# Patient Record
Sex: Female | Born: 1960 | Race: White | Hispanic: No | Marital: Married | Smoking: Current every day smoker
Health system: Southern US, Community
[De-identification: ages and names within clinical notes are randomized; demographics above are authoritative.]

## PROBLEM LIST (undated history)

## (undated) DIAGNOSIS — C801 Malignant (primary) neoplasm, unspecified: Secondary | ICD-10-CM

## (undated) DIAGNOSIS — G5601 Carpal tunnel syndrome, right upper limb: Principal | ICD-10-CM

## (undated) DIAGNOSIS — M1990 Unspecified osteoarthritis, unspecified site: Secondary | ICD-10-CM

## (undated) DIAGNOSIS — K219 Gastro-esophageal reflux disease without esophagitis: Secondary | ICD-10-CM

## (undated) DIAGNOSIS — J449 Chronic obstructive pulmonary disease, unspecified: Secondary | ICD-10-CM

## (undated) DIAGNOSIS — T7840XA Allergy, unspecified, initial encounter: Secondary | ICD-10-CM

## (undated) DIAGNOSIS — D649 Anemia, unspecified: Secondary | ICD-10-CM

## (undated) DIAGNOSIS — G4730 Sleep apnea, unspecified: Secondary | ICD-10-CM

## (undated) DIAGNOSIS — F17200 Nicotine dependence, unspecified, uncomplicated: Secondary | ICD-10-CM

## (undated) DIAGNOSIS — L309 Dermatitis, unspecified: Secondary | ICD-10-CM

## (undated) HISTORY — DX: Gastro-esophageal reflux disease without esophagitis: K21.9

## (undated) HISTORY — DX: Dermatitis, unspecified: L30.9

## (undated) HISTORY — DX: Anemia, unspecified: D64.9

## (undated) HISTORY — DX: Unspecified osteoarthritis, unspecified site: M19.90

## (undated) HISTORY — DX: Chronic obstructive pulmonary disease, unspecified: J44.9

## (undated) HISTORY — DX: Sleep apnea, unspecified: G47.30

## (undated) HISTORY — DX: Nicotine dependence, unspecified, uncomplicated: F17.200

## (undated) HISTORY — DX: Malignant (primary) neoplasm, unspecified: C80.1

## (undated) HISTORY — PX: POLYPECTOMY: SHX149

## (undated) HISTORY — DX: Carpal tunnel syndrome, right upper limb: G56.01

## (undated) HISTORY — DX: Allergy, unspecified, initial encounter: T78.40XA

## (undated) HISTORY — PX: ABLATION: SHX5711

---

## 2009-06-30 ENCOUNTER — Ambulatory Visit (HOSPITAL_COMMUNITY): Admission: RE | Admit: 2009-06-30 | Discharge: 2009-06-30 | Payer: Self-pay | Admitting: Obstetrics & Gynecology

## 2009-06-30 ENCOUNTER — Encounter (INDEPENDENT_AMBULATORY_CARE_PROVIDER_SITE_OTHER): Payer: Self-pay | Admitting: Obstetrics & Gynecology

## 2011-03-12 LAB — CBC
MCHC: 34.9 g/dL (ref 30.0–36.0)
MCV: 92.1 fL (ref 78.0–100.0)
RBC: 4.43 MIL/uL (ref 3.87–5.11)

## 2011-04-18 NOTE — Op Note (Signed)
Jennifer Werner, Jennifer Werner                ACCOUNT NO.:  [id number]   MEDICAL RECORD NO.:  [id number]          PATIENT TYPE:  AMB   LOCATION:  SDC                           FACILITY:  [facility]   PHYSICIAN:  Ilda Mori, M.D.   DATE OF BIRTH:  February 18, 1961   DATE OF PROCEDURE:  06/30/2009  DATE OF DISCHARGE:                               OPERATIVE REPORT   PREOPERATIVE DIAGNOSES:  Menorrhagia, possible endometrial polyp.   POSTOPERATIVE DIAGNOSES:  Menorrhagia, endometrial polyp.   PROCEDURES:  Hysteroscopy, dilatation and curettage, NovaSure ablation.   SURGEON:  Ilda Mori, MD   ANESTHESIA:  Paracervical block with IV sedation.   ESTIMATED BLOOD LOSS:  Minimal.   FINDINGS:  Small endometrial polyp was noted on the anterior fundal  area.  Otherwise, the cavity was symmetric and normal.   INDICATIONS:  This is a 50 year old parous female who for the last 2 or  3 years has been having increasingly heavy and painful periods.  The  patient presented to the office with these complaints and options were  discussed with her including Mirena IUD.  The patient, however, opted  for the NovaSure endometrial ablation.  The risks and benefits were  discussed with her.   PROCEDURE IN DETAILS:  The patient was taken to the operating room,  placed in the dorsal lithotomy position.  IV sedation was administered.  The time-out was performed.  The patient was then prepped and draped in  sterile fashion.  The bladder was catheterized.  The evaluation under  anesthesia revealed a retroverted uterus.  No adnexal masses.  The 10 mL  of 2% lidocaine was infiltrated in the paracervical tissues.  The edges  of the cervix was grasped with single-tooth tenaculum.  The cervix was  sounded to 3 cm.  The fundus was sounded to 9 cm.  The internal os was  dilated to 21-French.  Hysteroscope was introduced and the endometrial  cavity was evaluated, small polyp was noted.  Hysteroscope was removed.  A polyp forceps  was applied and a small polyp was removed.  After this,  a sharp curettage was carried out.  The hysteroscope was reintroduced,  then the cavity appeared without polyps at this point.  The internal os  was then dilated to 25-French.  The NovaSure instrument was introduced  and deployed to a cavity width of 4.2 cm.  The tubing was tightened and  the CO2 test was passed.  Following this, ablation was carried around 55  seconds.  The procedure was then terminated and the patient left the  operative room in good condition.      Ilda Mori, M.D.  Electronically Signed    RK/MEDQ  D:  06/30/2009  T:  06/30/2009  Job:  [id number]

## 2012-06-26 ENCOUNTER — Ambulatory Visit (INDEPENDENT_AMBULATORY_CARE_PROVIDER_SITE_OTHER): Payer: No Typology Code available for payment source | Admitting: Family Medicine

## 2012-06-26 VITALS — BP 136/72 | HR 75 | Temp 98.6°F | Resp 17 | Ht 65.0 in | Wt 187.0 lb

## 2012-06-26 DIAGNOSIS — R141 Gas pain: Secondary | ICD-10-CM

## 2012-06-26 DIAGNOSIS — IMO0001 Reserved for inherently not codable concepts without codable children: Secondary | ICD-10-CM

## 2012-06-26 DIAGNOSIS — R5383 Other fatigue: Secondary | ICD-10-CM

## 2012-06-26 DIAGNOSIS — M791 Myalgia, unspecified site: Secondary | ICD-10-CM

## 2012-06-26 DIAGNOSIS — R140 Abdominal distension (gaseous): Secondary | ICD-10-CM

## 2012-06-26 DIAGNOSIS — R5381 Other malaise: Secondary | ICD-10-CM

## 2012-06-26 DIAGNOSIS — R21 Rash and other nonspecific skin eruption: Secondary | ICD-10-CM

## 2012-06-26 DIAGNOSIS — N393 Stress incontinence (female) (male): Secondary | ICD-10-CM

## 2012-06-26 DIAGNOSIS — R143 Flatulence: Secondary | ICD-10-CM

## 2012-06-26 LAB — POCT UA - MICROSCOPIC ONLY
Casts, Ur, LPF, POC: NEGATIVE
Crystals, Ur, HPF, POC: NEGATIVE
Mucus, UA: NEGATIVE
Yeast, UA: NEGATIVE

## 2012-06-26 LAB — POCT URINALYSIS DIPSTICK
Bilirubin, UA: NEGATIVE
Glucose, UA: NEGATIVE
Ketones, UA: NEGATIVE
Leukocytes, UA: NEGATIVE
Nitrite, UA: NEGATIVE
Protein, UA: NEGATIVE
Spec Grav, UA: 1.01
Urobilinogen, UA: 0.2
pH, UA: 5.5

## 2012-06-26 LAB — COMPREHENSIVE METABOLIC PANEL
ALT: 84 U/L — ABNORMAL HIGH (ref 0–35)
AST: 51 U/L — ABNORMAL HIGH (ref 0–37)
Albumin: 4.3 g/dL (ref 3.5–5.2)
Alkaline Phosphatase: 73 U/L (ref 39–117)
BUN: 7 mg/dL (ref 6–23)
CO2: 26 mEq/L (ref 19–32)
Calcium: 9.4 mg/dL (ref 8.4–10.5)
Chloride: 101 mEq/L (ref 96–112)
Creat: 0.7 mg/dL (ref 0.50–1.10)
Glucose, Bld: 76 mg/dL (ref 70–99)
Potassium: 3.8 mEq/L (ref 3.5–5.3)
Sodium: 136 mEq/L (ref 135–145)
Total Bilirubin: 0.6 mg/dL (ref 0.3–1.2)
Total Protein: 7.4 g/dL (ref 6.0–8.3)

## 2012-06-26 LAB — POCT CBC
Granulocyte percent: 66.5 %G (ref 37–80)
HCT, POC: 48.8 % — AB (ref 37.7–47.9)
Hemoglobin: 15.2 g/dL (ref 12.2–16.2)
Lymph, poc: 2.7 (ref 0.6–3.4)
MCH, POC: 28.8 pg (ref 27–31.2)
MCHC: 31.1 g/dL — AB (ref 31.8–35.4)
MCV: 92.4 fL (ref 80–97)
MID (cbc): 0.8 (ref 0–0.9)
MPV: 9 fL (ref 0–99.8)
POC Granulocyte: 6.8 (ref 2–6.9)
POC LYMPH PERCENT: 26.1 %L (ref 10–50)
POC MID %: 7.4 %M (ref 0–12)
Platelet Count, POC: 320 10*3/uL (ref 142–424)
RBC: 5.28 M/uL (ref 4.04–5.48)
RDW, POC: 14.8 %
WBC: 10.3 10*3/uL — AB (ref 4.6–10.2)

## 2012-06-26 LAB — TSH: TSH: 1.207 u[IU]/mL (ref 0.350–4.500)

## 2012-06-26 LAB — POCT SEDIMENTATION RATE: POCT SED RATE: 12 mm/hr (ref 0–22)

## 2012-06-26 NOTE — Progress Notes (Signed)
51 yo woman with 1. Insomnia x 1 year 2. Fatigue chronically 3. Weight gain 4. Eye fatigue 5. Swollen finger joints 6. Two months of abdominal bloating 7. Red dermatitis both arms diagnosed with eczema  F/H lupus (aunt), lymphoma (mother)  Had uterine ablation 1 year ago.  Has had eyes checked.   O:  NAD Flushed cheeks with rough skin HEENT: hard palate red rash Chest: clear Heart:  Reg, no murmur Abd:  No masses, HSM Skin:  Papular rash on both lower extremities, rough dry skin with erythema and scaling on both arms diffusely   A:  Unusual rash and multiple systemic symptoms.  P:  Check labs. 1. Rash  TSH, POCT urinalysis dipstick, POCT UA - Microscopic Only, Comprehensive metabolic panel, POCT CBC, ANA  2. Bloating  TSH  3. Fatigue  TSH, POCT CBC, ANA  4. Stress incontinence  POCT urinalysis dipstick, POCT UA - Microscopic Only  5. Myalgia  POCT SEDIMENTATION RATE

## 2012-06-27 ENCOUNTER — Other Ambulatory Visit: Payer: Self-pay | Admitting: Family Medicine

## 2012-06-27 DIAGNOSIS — R945 Abnormal results of liver function studies: Secondary | ICD-10-CM

## 2012-06-27 DIAGNOSIS — R7989 Other specified abnormal findings of blood chemistry: Secondary | ICD-10-CM

## 2012-06-27 LAB — ANA: Anti Nuclear Antibody(ANA): NEGATIVE

## 2012-07-01 ENCOUNTER — Other Ambulatory Visit: Payer: Self-pay | Admitting: Family Medicine

## 2012-07-01 DIAGNOSIS — R5383 Other fatigue: Secondary | ICD-10-CM

## 2013-07-30 ENCOUNTER — Encounter: Payer: Self-pay | Admitting: Family Medicine

## 2014-03-04 ENCOUNTER — Encounter: Payer: Self-pay | Admitting: Podiatry

## 2014-03-04 ENCOUNTER — Ambulatory Visit (INDEPENDENT_AMBULATORY_CARE_PROVIDER_SITE_OTHER): Payer: No Typology Code available for payment source | Admitting: Podiatry

## 2014-03-04 VITALS — BP 132/65 | HR 68 | Ht 64.0 in | Wt 190.0 lb

## 2014-03-04 DIAGNOSIS — M216X9 Other acquired deformities of unspecified foot: Secondary | ICD-10-CM | POA: Insufficient documentation

## 2014-03-04 DIAGNOSIS — M21969 Unspecified acquired deformity of unspecified lower leg: Secondary | ICD-10-CM | POA: Insufficient documentation

## 2014-03-04 DIAGNOSIS — M722 Plantar fascial fibromatosis: Secondary | ICD-10-CM

## 2014-03-04 HISTORY — DX: Other acquired deformities of unspecified foot: M21.6X9

## 2014-03-04 HISTORY — DX: Plantar fascial fibromatosis: M72.2

## 2014-03-04 HISTORY — DX: Unspecified acquired deformity of unspecified lower leg: M21.969

## 2014-03-04 NOTE — Progress Notes (Signed)
Subjective: 53 year old female presents complaining of left heel pain, plantar lateral, and in center x 2 years off and on.  Last 2 months been flared up bad and not going away. Gotten OTC inserts and new pair shoes. They are not helping.  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  Review of Systems - General ROS: negative for - chills, fatigue, fever, night sweats or weight loss Psychological ROS: negative Ophthalmic ROS: negative ENT ROS: negative Endocrine ROS: negative Breast ROS: negative for breast lumps Respiratory ROS: no cough, shortness of breath, or wheezing Cardiovascular ROS: no chest pain or dyspnea on exertion Gastrointestinal ROS: no abdominal pain, change in bowel habits, or black or bloody stools Genito-Urinary ROS: no dysuria, trouble voiding, or hematuria Musculoskeletal ROS: Only foot pain. Neurological ROS: negative Dermatological ROS: Eczema on arms and leg.  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 Pain at plantar lateral left. Neurovascular status are within normal. Rectus foot without gross deformities. Radiographic examination show high arched foot with elevated first ray bilateral. Posterior calcaneal spur bilateral. No spur at plantar heel.   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 Plantar fasciitis left. Metatarus primus elevatus bilateral. Cavus foot.  Plan: Reviewed findings and available treatment options, such as NSAIA, pain pills, Orthotics, Night splint, Cortisone inections.  Left heel injected with mixture of 4mg  Dexamethasone and 4mg  Triamcinolone and 38ml of 0.5% Marcaine plain and 1% Xylocaine plain. Patient tolerated well.  Night splint dispensed for left lower limb. Discussed Orthotic preparation.

## 2014-03-04 NOTE — Patient Instructions (Signed)
Seen for heel pain.  Noted of tight Achilles tendon L>R. X-ray shoe bony spur near achilles tendon and dorsally displacing first metatarsal bone. Injection to left heel and Night splint dispensed. Will contact insurance co for orthotic coverage. Will do orthotics on next visit.

## 2014-03-09 ENCOUNTER — Ambulatory Visit: Payer: No Typology Code available for payment source | Admitting: Podiatry

## 2018-04-02 ENCOUNTER — Encounter: Payer: Self-pay | Admitting: Physician Assistant

## 2018-04-02 ENCOUNTER — Ambulatory Visit (INDEPENDENT_AMBULATORY_CARE_PROVIDER_SITE_OTHER): Payer: No Typology Code available for payment source

## 2018-04-02 ENCOUNTER — Other Ambulatory Visit: Payer: Self-pay

## 2018-04-02 ENCOUNTER — Ambulatory Visit: Payer: No Typology Code available for payment source | Admitting: Physician Assistant

## 2018-04-02 VITALS — BP 130/78 | HR 88 | Temp 98.6°F | Resp 18 | Ht 64.57 in | Wt 207.0 lb

## 2018-04-02 DIAGNOSIS — F17200 Nicotine dependence, unspecified, uncomplicated: Secondary | ICD-10-CM | POA: Diagnosis not present

## 2018-04-02 DIAGNOSIS — M7989 Other specified soft tissue disorders: Secondary | ICD-10-CM

## 2018-04-02 DIAGNOSIS — R0989 Other specified symptoms and signs involving the circulatory and respiratory systems: Secondary | ICD-10-CM | POA: Diagnosis not present

## 2018-04-02 DIAGNOSIS — Z1329 Encounter for screening for other suspected endocrine disorder: Secondary | ICD-10-CM | POA: Diagnosis not present

## 2018-04-02 DIAGNOSIS — R0602 Shortness of breath: Secondary | ICD-10-CM

## 2018-04-02 DIAGNOSIS — E66811 Obesity, class 1: Secondary | ICD-10-CM

## 2018-04-02 DIAGNOSIS — F17210 Nicotine dependence, cigarettes, uncomplicated: Secondary | ICD-10-CM | POA: Insufficient documentation

## 2018-04-02 DIAGNOSIS — R0902 Hypoxemia: Secondary | ICD-10-CM

## 2018-04-02 DIAGNOSIS — E669 Obesity, unspecified: Secondary | ICD-10-CM

## 2018-04-02 DIAGNOSIS — Z13220 Encounter for screening for lipoid disorders: Secondary | ICD-10-CM | POA: Diagnosis not present

## 2018-04-02 DIAGNOSIS — I517 Cardiomegaly: Secondary | ICD-10-CM | POA: Diagnosis not present

## 2018-04-02 DIAGNOSIS — Z87891 Personal history of nicotine dependence: Secondary | ICD-10-CM

## 2018-04-02 HISTORY — DX: Personal history of nicotine dependence: Z87.891

## 2018-04-02 HISTORY — DX: Nicotine dependence, cigarettes, uncomplicated: F17.210

## 2018-04-02 MED ORDER — PREDNISONE 10 MG PO TABS: ORAL_TABLET | ORAL | 0 refills | Status: AC

## 2018-04-02 MED ORDER — IPRATROPIUM-ALBUTEROL 20-100 MCG/ACT IN AERS: 1.0000 | INHALATION_SPRAY | Freq: Four times a day (QID) | RESPIRATORY_TRACT | 1 refills | Status: DC

## 2018-04-02 MED ORDER — IPRATROPIUM BROMIDE 0.02 % IN SOLN
0.5000 mg | Freq: Once | RESPIRATORY_TRACT | Status: AC
  Administered 2018-04-02: 0.5 mg via RESPIRATORY_TRACT

## 2018-04-02 MED ORDER — FUROSEMIDE 20 MG PO TABS: 20.0000 mg | ORAL_TABLET | Freq: Every day | ORAL | 0 refills | Status: DC

## 2018-04-02 MED ORDER — ALBUTEROL SULFATE (2.5 MG/3ML) 0.083% IN NEBU
2.5000 mg | INHALATION_SOLUTION | Freq: Once | RESPIRATORY_TRACT | Status: AC
  Administered 2018-04-02: 2.5 mg via RESPIRATORY_TRACT

## 2018-04-02 MED ORDER — PREDNISONE 10 MG PO TABS: ORAL_TABLET | ORAL | 0 refills | Status: DC

## 2018-04-02 MED ORDER — VARENICLINE TARTRATE 0.5 MG X 11 & 1 MG X 42 PO MISC: ORAL | 0 refills | Status: DC

## 2018-04-02 MED ORDER — FLUTICASONE-SALMETEROL 500-50 MCG/DOSE IN AEPB: 1.0000 | INHALATION_SPRAY | Freq: Two times a day (BID) | RESPIRATORY_TRACT | 1 refills | Status: DC

## 2018-04-02 NOTE — Progress Notes (Signed)
Jennifer Werner  MRN: [id number] DOB: Feb 20, 1961  PCP: [name], PA-C  Chief Complaint  Patient presents with  . Establish Care    Subjective:  Pt presents to clinic to establish care.  She has not had routine medical care for several years.  She is worried about shortness of breath.  - she has had for the last couple of years worse over the last 2-3 months though nothing has changed in her lifestyle or smoking.  She is short of breath all the time but activity makes it worse. - she is a smoker.  Leaning on her chart while shopping.  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  She does not have chest pain while she has shortness of breath.  Home oximetry - at rest 90% moving 77% - over the last several weeks - she is worried now.  She is swollen in the finger and foot - she has been using lasix that she got from her mother.  It has helped her breathing.  Leg swelling is almost gone in the am - worse as the day goes on.  She has compression socks but does not wear them regularly and does not know what compression they are.   Smokes - 1.5 ppd - in the past 2 ppd - 47 years - 60-70 pack year history.  She works - Marine scientist - Press photographer work.  She has rashes all over the body - she has had dermatologist in the past and has been diagnosed with eczema.  She has had trouble with her skin all of her life currently she does not use soap but rather lotion as she days due to extremely dry skin.  She also has spots that she develops that comes and goes with times some times they open and dark red blood comes out or pus sometimes they completely resolve on their own.  These are located in her axilla underneath her breasts where her bra sits in her groin.  When she has these they hurt when they resolve she has no problems.  History is obtained by patient.  Review of Systems  Constitutional: Negative for chills  and fever.  HENT: Negative.   Respiratory: Positive for cough (am productive - yellowish -- as the day goes on dry and then wet in the evening), shortness of breath and wheezing (all the time - ).   Cardiovascular: Positive for leg swelling. Negative for chest pain.  Allergic/Immunologic: Negative for environmental allergies.  Psychiatric/Behavioral: Positive for sleep disturbance (problem for the last 10 years - cannot get to sleep nor stay asleep).    Patient Active Problem List   Diagnosis Date Noted  . Smoker 04/02/2018  . Plantar fasciitis of left foot 03/04/2014  . Metatarsal deformity 03/04/2014  . Equinus deformity of foot, acquired 03/04/2014    No current outpatient medications on file prior to visit.   No current facility-administered medications on file prior to visit.     Allergies  Allergen Reactions  . Latex Rash  . Codeine     No past medical history on file. Social History   Social History Narrative  . Not on file   Social History   Tobacco Use  . Smoking status: Current Every Day Smoker    Packs/day: 2.00    Years: 37.00    Pack years: 74.00    Types: Cigarettes  .  Smokeless tobacco: Never Used  Substance Use Topics  . Alcohol use: Not on file  . Drug use: Not on file   family history is not on file.     Objective:  BP 130/78   Pulse 88   Temp 98.6 F (37 C) (Oral)   Resp 18   Ht 5' 4.57" (1.64 m)   Wt 207 lb (93.9 kg)   SpO2 (!) 87%   PF 100 L/min   BMI 34.91 kg/m  Body mass index is 34.91 kg/m.  Physical Exam  Constitutional: She is oriented to person, place, and time. She appears well-developed and well-nourished.  HENT:  Head: Normocephalic and atraumatic.  Right Ear: Hearing and external ear normal.  Left Ear: Hearing and external ear normal.  Eyes: Conjunctivae are normal.  Neck: Normal range of motion.  Cardiovascular: Normal rate, regular rhythm and normal heart sounds.  No murmur heard. Pulmonary/Chest: Effort  normal. She has wheezes (end inspiratory wheezing).  Able to talk in full sentences and quickly - she does have audible wheezing when close.  Expiration longer than inspiration - breaths sounds difficult to hear.  Neurological: She is alert and oriented to person, place, and time.  Skin: Skin is warm and dry.  Psychiatric: She has a normal mood and affect. Her behavior is normal. Judgment and thought content normal.  Vitals reviewed.  Rhythm: sinus rhythm  at a rate of 67. Findings: NSR without acute changes Last EKG: none  I have personally reviewed the EKG tracing and agree with the computerized printout.  Dg Chest 2 View  Result Date: 04/02/2018 CLINICAL DATA:  Shortness of breath, low oxygen saturation, smoking history EXAM: CHEST - 2 VIEW COMPARISON:  None. FINDINGS: No active infiltrate or effusion is seen. Minimal scarring is noted on the lateral view in the region of the lingula. Mediastinal and hilar contours are unremarkable. The heart is within upper limits of normal. No bony abnormality is seen. IMPRESSION: 1. No active lung disease. Probable linear scarring in the region of the lingula. 2. Borderline cardiomegaly. Electronically Signed   By: Ivar Drape M.D.   On: 04/02/2018 12:14   After albuterol atrovent neb- more air movement - pt feels less SOB - her pulse ox was 90% while talking after walking 2 laps while talking she went to 77% - she felt better - she did not have to stop and lean on wall as she would normally have had to do.   Assessment and Plan :  Shortness of breath - Plan: CBC with Differential/Platelet, CMP14+EGFR, DG Chest 2 View, albuterol (PROVENTIL) (2.5 MG/3ML) 0.083% nebulizer solution 2.5 mg, ipratropium (ATROVENT) nebulizer solution 0.5 mg, Ipratropium-Albuterol (COMBIVENT RESPIMAT) 20-100 MCG/ACT AERS respimat, Fluticasone-Salmeterol (ADVAIR DISKUS) 500-50 MCG/DOSE AEPB, Ambulatory referral to Cardiology, predniSONE (DELTASONE) 10 MG tablet, DISCONTINUED:  predniSONE (DELTASONE) 10 MG tablet  Smoker - Plan: DG Chest 2 View, varenicline (CHANTIX STARTING MONTH PAK) 0.5 MG X 11 & 1 MG X 42 tablet, predniSONE (DELTASONE) 10 MG tablet  Hypoxemia - Plan: EKG 12-Lead, DG Chest 2 View, Ambulatory referral to Pulmonology, predniSONE (DELTASONE) 10 MG tablet  Screening cholesterol level - Plan: Lipid panel  Screening for thyroid disorder - Plan: TSH  Obesity, Class I, BMI 30-34.9 - Plan: Hemoglobin A1c  Abnormally low peak expiratory flow rate  Leg swelling - Plan: furosemide (LASIX) 20 MG tablet, Ambulatory referral to Vascular Surgery  Mild cardiomegaly - Plan: Ambulatory referral to Cardiology   We will do screening labs today as she  has not had medical care for a while.  With a normal EKG negative chest x-ray I feel confident that this is likely COPD especially with her smoking history.  We will start Chantix to help decrease her smoking at this time.  A starter pack was given and she will follow-up with me in a month.  She has been on prednisone in the past that really helped her breathing we will start a 12-day taper at this time as well as start Advair and Combivent 4 times a day to help this likely COPD diagnosis.  She is not interested in oxygen at this time she does have a pulse oximetry at home she will continue to monitor this if it gets worse than what she has had in the last 2 to 3 weeks she will contact me.  She likely would benefit from oxygen use especially while asleep though at this time she is not interested.  Due to her hypoxemia we will also send to cardiology for questionable possible pulmonary hypertension and likely needing an echo to evaluate this.  Due to the swelling her legs we have also referred her to vascular for ultrasounds she was encouraged to use compression socks as this may help her swelling she was given a refill of Lasix to help with this in the meantime.  She did like the clinic today feeling much better than she has  in weeks per patient.  D/w Dr Kathy Breach PA-C  [facility] 04/02/2018 1:12 PM

## 2018-04-02 NOTE — Patient Instructions (Addendum)
  Compression sock - 25-35 mghg - go2socks or CEP are great  Please download the APP called mychart - then use the text to activate this APP - this will allow you to look at your labs and contact me as well as make appointments to see me in the future.    IF you received an x-ray today, you will receive an invoice from [facility] Radiology. Please contact [facility] at [phone] with questions or concerns regarding your invoice.   IF you received labwork today, you will receive an invoice from [facility]. Please contact [facility] at [phone] with questions or concerns regarding your invoice.   Our billing staff will not be able to assist you with questions regarding bills from these companies.  You will be contacted with the lab results as soon as they are available. The fastest way to get your results is to activate your My Chart account. Instructions are located on the last page of this paperwork. If you have not heard from [place] regarding the results in 2 weeks, please contact this office.

## 2018-04-03 LAB — CMP14+EGFR
A/G RATIO: 1.5 (ref 1.2–2.2)
ALT: 48 IU/L — ABNORMAL HIGH (ref 0–32)
AST: 34 IU/L (ref 0–40)
Albumin: 4.3 g/dL (ref 3.5–5.5)
Alkaline Phosphatase: 79 IU/L (ref 39–117)
BUN/Creatinine Ratio: 12 (ref 9–23)
BUN: 9 mg/dL (ref 6–24)
Bilirubin Total: 0.3 mg/dL (ref 0.0–1.2)
CALCIUM: 9.8 mg/dL (ref 8.7–10.2)
CO2: 28 mmol/L (ref 20–29)
Chloride: 100 mmol/L (ref 96–106)
Creatinine, Ser: 0.74 mg/dL (ref 0.57–1.00)
GFR, EST AFRICAN AMERICAN: 104 mL/min/{1.73_m2} (ref >59)
GFR, EST NON AFRICAN AMERICAN: 90 mL/min/{1.73_m2} (ref >59)
GLOBULIN, TOTAL: 2.8 g/dL (ref 1.5–4.5)
Glucose: 84 mg/dL (ref 65–99)
POTASSIUM: 4.9 mmol/L (ref 3.5–5.2)
Sodium: 144 mmol/L (ref 134–144)
Total Protein: 7.1 g/dL (ref 6.0–8.5)

## 2018-04-03 LAB — CBC WITH DIFFERENTIAL/PLATELET
BASOS: 0 %
Basophils Absolute: 0 10*3/uL (ref 0.0–0.2)
EOS (ABSOLUTE): 0.2 10*3/uL (ref 0.0–0.4)
EOS: 3 %
HEMOGLOBIN: 16.6 g/dL — AB (ref 11.1–15.9)
Hematocrit: 50.3 % — ABNORMAL HIGH (ref 34.0–46.6)
IMMATURE GRANULOCYTES: 0 %
Immature Grans (Abs): 0 10*3/uL (ref 0.0–0.1)
Lymphocytes Absolute: 1.8 10*3/uL (ref 0.7–3.1)
Lymphs: 22 %
MCH: 29.2 pg (ref 26.6–33.0)
MCHC: 33 g/dL (ref 31.5–35.7)
MCV: 88 fL (ref 79–97)
Monocytes Absolute: 0.6 10*3/uL (ref 0.1–0.9)
Monocytes: 8 %
NEUTROS PCT: 67 %
Neutrophils Absolute: 5.6 10*3/uL (ref 1.4–7.0)
Platelets: 260 10*3/uL (ref 150–379)
RBC: 5.69 x10E6/uL — ABNORMAL HIGH (ref 3.77–5.28)
RDW: 14.8 % (ref 12.3–15.4)
WBC: 8.3 10*3/uL (ref 3.4–10.8)

## 2018-04-03 LAB — LIPID PANEL
CHOL/HDL RATIO: 3.6 ratio (ref 0.0–4.4)
Cholesterol, Total: 166 mg/dL (ref 100–199)
HDL: 46 mg/dL (ref >39)
LDL Calculated: 102 mg/dL — ABNORMAL HIGH (ref 0–99)
Triglycerides: 90 mg/dL (ref 0–149)
VLDL Cholesterol Cal: 18 mg/dL (ref 5–40)

## 2018-04-03 LAB — HEMOGLOBIN A1C
ESTIMATED AVERAGE GLUCOSE: 123 mg/dL
Hgb A1c MFr Bld: 5.9 % — ABNORMAL HIGH (ref 4.8–5.6)

## 2018-04-03 LAB — TSH: TSH: 0.886 u[IU]/mL (ref 0.450–4.500)

## 2018-04-09 ENCOUNTER — Ambulatory Visit: Payer: PRIVATE HEALTH INSURANCE | Admitting: Cardiology

## 2018-04-09 ENCOUNTER — Encounter: Payer: Self-pay | Admitting: Cardiology

## 2018-04-09 VITALS — BP 130/68 | HR 62 | Resp 93 | Ht 64.75 in | Wt 208.8 lb

## 2018-04-09 DIAGNOSIS — R0602 Shortness of breath: Secondary | ICD-10-CM | POA: Diagnosis not present

## 2018-04-09 DIAGNOSIS — R0609 Other forms of dyspnea: Secondary | ICD-10-CM | POA: Insufficient documentation

## 2018-04-09 DIAGNOSIS — J449 Chronic obstructive pulmonary disease, unspecified: Secondary | ICD-10-CM | POA: Insufficient documentation

## 2018-04-09 DIAGNOSIS — R0600 Dyspnea, unspecified: Secondary | ICD-10-CM

## 2018-04-09 DIAGNOSIS — F17200 Nicotine dependence, unspecified, uncomplicated: Secondary | ICD-10-CM | POA: Diagnosis not present

## 2018-04-09 DIAGNOSIS — J410 Simple chronic bronchitis: Secondary | ICD-10-CM

## 2018-04-09 DIAGNOSIS — M7989 Other specified soft tissue disorders: Secondary | ICD-10-CM

## 2018-04-09 HISTORY — DX: Dyspnea, unspecified: R06.00

## 2018-04-09 HISTORY — DX: Chronic obstructive pulmonary disease, unspecified: J44.9

## 2018-04-09 HISTORY — DX: Other forms of dyspnea: R06.09

## 2018-04-09 MED ORDER — FUROSEMIDE 40 MG PO TABS: 40.0000 mg | ORAL_TABLET | Freq: Every day | ORAL | 3 refills | Status: DC

## 2018-04-09 MED ORDER — POTASSIUM CHLORIDE ER 10 MEQ PO TBCR: 10.0000 meq | EXTENDED_RELEASE_TABLET | Freq: Every day | ORAL | 3 refills | Status: DC

## 2018-04-09 NOTE — Progress Notes (Signed)
Cardiology Consultation:    Date:  04/09/2018   ID:  Jennifer Werner, DOB 11/04/1961, MRN [id number]  PCP:  [name], PA-C  Cardiologist:  Jenne Campus, MD   Referring MD: [name], PA-C   Chief Complaint  Patient presents with  . Shortness of Breath  . Cardiomegaly  I am short of breath  History of Present Illness:    Jennifer Werner is a 57 y.o. female who is being seen today for the evaluation of shortness of breath at the request of Weber, Damaris Hippo, PA-C.  She comes to [place] because of shortness of breath.  This is a gradual onset over.  Of months to years however lately she became severely short of breath to the point that she was not able to function.  She went to see her primary care physician she was given bronchodilators as well as some diuretic and she feels dramatically better she tells me 80% better last week and for the first time she was able to do laundry and walk up and down stairs with no major difficulties.  Described to have some swelling of lower extremities also some shortness of breath at night.  She is a Marine scientist by profession and she thinks she may be having some congestive heart failure.  She is a chronic smoker has been smoking for many years and she is try Chantix right now to quit.  Denies have any chest pain tightness squeezing pressure burning chest no palpitations.  Past Medical History:  Diagnosis Date  . Eczema   . Smoker     Past Surgical History:  Procedure Laterality Date  . ABLATION     Uterus    Current Medications: Current Meds  Medication Sig  . Fluticasone-Salmeterol (ADVAIR DISKUS) 500-50 MCG/DOSE AEPB Inhale 1 puff into the lungs 2 (two) times daily.  . furosemide (LASIX) 20 MG tablet Take 1 tablet (20 mg total) by mouth daily.  . Ipratropium-Albuterol (COMBIVENT RESPIMAT) 20-100 MCG/ACT AERS respimat Inhale 1 puff into the lungs every 6 (six) hours.  . varenicline (CHANTIX STARTING MONTH PAK) 0.5 MG X 11 & 1 MG X 42 tablet Take  0.5 mg tablet by po qd for 3 days, then 0.5 mg po bid for 4 days, then  1 mg po bid.     Allergies:   Latex and Codeine   Social History   Socioeconomic History  . Marital status: Married    Spouse name: Not on file  . Number of children: Not on file  . Years of education: Not on file  . Highest education level: Not on file  Occupational History  . Not on file  Social Needs  . Financial resource strain: Not on file  . Food insecurity:    Worry: Not on file    Inability: Not on file  . Transportation needs:    Medical: Not on file    Non-medical: Not on file  Tobacco Use  . Smoking status: Current Every Day Smoker    Packs/day: 2.00    Years: 37.00    Pack years: 74.00    Types: Cigarettes  . Smokeless tobacco: Never Used  Substance and Sexual Activity  . Alcohol use: Yes    Comment: 5-7 drinks a week  . Drug use: Never  . Sexual activity: Not on file  Lifestyle  . Physical activity:    Days per week: Not on file    Minutes per session: Not on file  .  Stress: Not on file  Relationships  . Social connections:    Talks on phone: Not on file    Gets together: Not on file    Attends religious service: Not on file    Active member of club or organization: Not on file    Attends meetings of clubs or organizations: Not on file    Relationship status: Not on file  Other Topics Concern  . Not on file  Social History Narrative   RN - desk work mainly   Lives with husband     Family History: The patient's family history includes Alcohol abuse in her brother and father; Cardiomyopathy in her brother, father, paternal aunt, and paternal uncle; Congestive Heart Failure in her mother; Heart block in her mother; Lymphoma in her mother. ROS:   Please see the history of present illness.    All 14 point review of systems negative except as described per history of present illness.  EKGs/Labs/Other Studies Reviewed:    The following studies were reviewed today:   EKG:   EKG is  ordered today.  The ekg ordered today demonstrates normal sinus rhythm normal P interval normal QS complex duration morphology  Recent Labs: 04/02/2018: ALT 48; BUN 9; Creatinine, Ser 0.74; Hemoglobin 16.6; Platelets 260; Potassium 4.9; Sodium 144; TSH 0.886  Recent Lipid Panel    Component Value Date/Time   CHOL 166 04/02/2018 1222   TRIG 90 04/02/2018 1222   HDL 46 04/02/2018 1222   CHOLHDL 3.6 04/02/2018 1222   LDLCALC 102 (H) 04/02/2018 1222    Physical Exam:    VS:  BP 130/68 (BP Location: Left Arm)   Pulse 62   Resp (!) 93   Ht 5' 4.75" (1.645 m)   Wt 208 lb 12.8 oz (94.7 kg)   BMI 35.01 kg/m     Wt Readings from Last 3 Encounters:  04/09/18 208 lb 12.8 oz (94.7 kg)  04/02/18 207 lb (93.9 kg)  03/04/14 190 lb (86.2 kg)     GEN:  Well nourished, well developed in no acute distress HEENT: Normal NECK: No JVD; No carotid bruits LYMPHATICS: No lymphadenopathy CARDIAC: RRR, no murmurs, no rubs, no gallops RESPIRATORY:  Clear to auscultation without rales, wheezing or rhonchi  ABDOMEN: Soft, non-tender, non-distended MUSCULOSKELETAL:  No edema; No deformity  SKIN: Warm and dry NEUROLOGIC:  Alert and oriented x 3 PSYCHIATRIC:  Normal affect   ASSESSMENT:    1. Smoker   2. Dyspnea on exertion   3. Simple chronic bronchitis (HCC)    PLAN:    In order of problems listed above:  1. Dyspnea on exertion: Most likely multifactorial.  She already feels better but is difficult to judge if this is bronchodilators of diuretic that helps.  I will ask her to have an echocardiogram to assess left ventricular ejection fraction.  We will do Chem-7 as well as proBNP today.  See her back in my office in about 3 weeks to see how she does. 2. Smoking: She already started Chantix we did talk about techniques that she can use to help to quit smoking.  She is determined and hopefully she will be able to do it. 3. COPD: Managed by internal medicine team.  See her back in my  office in about 3 weeks or sooner if she has a problem   Medication Adjustments/Labs and Tests Ordered: Current medicines are reviewed at length with the patient today.  Concerns regarding medicines are outlined above.  No orders of  the defined types were placed in this encounter.  No orders of the defined types were placed in this encounter.   Signed, Park Liter, MD, [facility]. 04/09/2018 9:35 AM    [place]

## 2018-04-09 NOTE — Patient Instructions (Signed)
Medication Instructions:  Your physician has recommended you make the following change in your medication:  START potassium 10 mEq daily INCREASE furosemide (lasix) 40 mg daily   Labwork: Your physician recommends that you return for lab work today: BMP, proBNP.   Testing/Procedures: Your physician has requested that you have an echocardiogram. Echocardiography is a painless test that uses sound waves to create images of your heart. It provides your doctor with information about the size and shape of your heart and how well your heart's chambers and valves are working. This procedure takes approximately one hour. There are no restrictions for this procedure.  Follow-Up: Your physician recommends that you schedule a follow-up appointment in: 3 weeks.   Any Other Special Instructions Will Be Listed Below (If Applicable).     If you need a refill on your cardiac medications before your next appointment, please call your pharmacy.

## 2018-04-10 LAB — BASIC METABOLIC PANEL
BUN/Creatinine Ratio: 21 (ref 9–23)
BUN: 14 mg/dL (ref 6–24)
CO2: 25 mmol/L (ref 20–29)
Calcium: 9.6 mg/dL (ref 8.7–10.2)
Chloride: 99 mmol/L (ref 96–106)
Creatinine, Ser: 0.67 mg/dL (ref 0.57–1.00)
GFR, EST AFRICAN AMERICAN: 113 mL/min/{1.73_m2} (ref >59)
GFR, EST NON AFRICAN AMERICAN: 98 mL/min/{1.73_m2} (ref >59)
Glucose: 89 mg/dL (ref 65–99)
Potassium: 4.4 mmol/L (ref 3.5–5.2)
SODIUM: 140 mmol/L (ref 134–144)

## 2018-04-10 LAB — PRO B NATRIURETIC PEPTIDE: NT-Pro BNP: 30 pg/mL (ref 0–287)

## 2018-04-16 ENCOUNTER — Other Ambulatory Visit (HOSPITAL_COMMUNITY): Payer: PRIVATE HEALTH INSURANCE

## 2018-04-18 ENCOUNTER — Other Ambulatory Visit: Payer: Self-pay

## 2018-04-18 ENCOUNTER — Ambulatory Visit (HOSPITAL_COMMUNITY): Payer: PRIVATE HEALTH INSURANCE | Attending: Cardiovascular Disease

## 2018-04-18 DIAGNOSIS — Z720 Tobacco use: Secondary | ICD-10-CM | POA: Insufficient documentation

## 2018-04-18 DIAGNOSIS — I517 Cardiomegaly: Secondary | ICD-10-CM | POA: Diagnosis not present

## 2018-04-18 DIAGNOSIS — I313 Pericardial effusion (noninflammatory): Secondary | ICD-10-CM | POA: Diagnosis not present

## 2018-04-18 DIAGNOSIS — R0609 Other forms of dyspnea: Secondary | ICD-10-CM | POA: Insufficient documentation

## 2018-04-18 DIAGNOSIS — M7989 Other specified soft tissue disorders: Secondary | ICD-10-CM

## 2018-04-19 ENCOUNTER — Encounter: Payer: Self-pay | Admitting: Physician Assistant

## 2018-04-19 ENCOUNTER — Ambulatory Visit (INDEPENDENT_AMBULATORY_CARE_PROVIDER_SITE_OTHER): Payer: No Typology Code available for payment source | Admitting: Physician Assistant

## 2018-04-19 ENCOUNTER — Other Ambulatory Visit: Payer: Self-pay

## 2018-04-19 VITALS — BP 118/72 | HR 85 | Temp 99.3°F | Resp 18 | Ht 64.75 in | Wt 212.6 lb

## 2018-04-19 DIAGNOSIS — F17200 Nicotine dependence, unspecified, uncomplicated: Secondary | ICD-10-CM | POA: Diagnosis not present

## 2018-04-19 DIAGNOSIS — R0602 Shortness of breath: Secondary | ICD-10-CM

## 2018-04-19 DIAGNOSIS — G4710 Hypersomnia, unspecified: Secondary | ICD-10-CM | POA: Diagnosis not present

## 2018-04-19 DIAGNOSIS — G479 Sleep disorder, unspecified: Secondary | ICD-10-CM

## 2018-04-19 NOTE — Progress Notes (Signed)
Jennifer Werner  MRN: [id number] DOB: 12-Mar-1961  PCP: [name], PA-C  Chief Complaint  Patient presents with  . Follow-up    SOB     Subjective:  Pt presents to clinic for recheck of her SOB.  She feels so much better starting her 2 inhalers.  She went on vacation last week and was able to walk up steps which she knew a month ago she would not have been able to do.  She is seen the cardiologist and had an echocardiogram which is good cardiac function but she continues to have feet swelling if she misses her Lasix dose.  She saw the cardiologist for her Lasix dose was increased to 40 mg, she has been taking 20 mg in the morning and augmenting that with an additional 20 in the afternoon if she has a swelling at that time.  O2 sat at home 90-94% -this was after walking up and down the steps several times.  Day 11 on Chantix -has not noticed a difference in her need or interest to smoke. Uses candy to help with some of her oral fixation. She just got back from vacation where she tried to surround herself in situations where non-smoking was preferred.  She felt like she did decrease her smoking compared to what she would have done prior to the Chantix but part of this might of been her awareness of her shortness of breath and how bad she felt prior to vacation.  Patient does not sleep well.  She has trouble falling asleep and trouble staying asleep.  She is up multiple times in the night to go the bathroom.  She does snore.  She has found herself when she is not in the bed but rather sleeping sitting up waking herself up.  Her husband notes she snore but does not ever tell her anything about apneic-like behavior.  Reviewed cardiology note. Reviewed echocardiogram results Reviewed referral to pulmonology -she will call and make that appointment. She has an appointment with vascular in June.  History is obtained by patient.  Review of Systems  Constitutional: Positive for fatigue  (tired all the time - does not sleep well).  Respiratory: Positive for shortness of breath (much better).   Cardiovascular: Positive for leg swelling (much better on lasix).    Patient Active Problem List   Diagnosis Date Noted  . Dyspnea on exertion 04/09/2018  . COPD (chronic obstructive pulmonary disease) ([place]) 04/09/2018  . Smoker 04/02/2018  . Plantar fasciitis of left foot 03/04/2014  . Metatarsal deformity 03/04/2014  . Equinus deformity of foot, acquired 03/04/2014    Current Outpatient Medications on File Prior to Visit  Medication Sig Dispense Refill  . Fluticasone-Salmeterol (ADVAIR DISKUS) 500-50 MCG/DOSE AEPB Inhale 1 puff into the lungs 2 (two) times daily. 60 each 1  . furosemide (LASIX) 40 MG tablet Take 1 tablet (40 mg total) by mouth daily. 90 tablet 3  . Ipratropium-Albuterol (COMBIVENT RESPIMAT) 20-100 MCG/ACT AERS respimat Inhale 1 puff into the lungs every 6 (six) hours. 4 g 1  . potassium chloride (K-DUR) 10 MEQ tablet Take 1 tablet (10 mEq total) by mouth daily. 90 tablet 3  . varenicline (CHANTIX STARTING MONTH PAK) 0.5 MG X 11 & 1 MG X 42 tablet Take 0.5 mg tablet by po qd for 3 days, then 0.5 mg po bid for 4 days, then  1 mg po bid. 53 tablet 0   No current facility-administered medications on file prior to visit.  Allergies  Allergen Reactions  . Latex Rash  . Codeine     Past Medical History:  Diagnosis Date  . Eczema   . 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 Smoking status: Current Every Day Smoker    Packs/day: 2.00    Years: 37.00    Pack years: 74.00    Types: Cigarettes  . Smokeless tobacco: Never Used  Substance Use Topics  . Alcohol use: Yes    Comment: 5-7 drinks a week  . Drug use: Never   family history includes Alcohol abuse in her brother and father; Cardiomyopathy in her brother, father, paternal aunt, and paternal uncle;  Congestive Heart Failure in her mother; Heart block in her mother; Lymphoma in her mother.     Objective:  BP 118/72   Pulse 85   Temp 99.3 F (37.4 C) (Oral)   Resp 18   Ht 5' 4.75" (1.645 m)   Wt 212 lb 9.6 oz (96.4 kg)   SpO2 93%   BMI 35.65 kg/m  Body mass index is 35.65 kg/m.  Physical Exam  Constitutional: She is oriented to person, place, and time. She appears well-developed and well-nourished.  HENT:  Head: Normocephalic and atraumatic.  Right Ear: Hearing and external ear normal.  Left Ear: Hearing and external ear normal.  Eyes: Conjunctivae are normal.  Neck: Normal range of motion.  Cardiovascular: Normal rate, regular rhythm and normal heart sounds.  No murmur heard. Pulmonary/Chest: Effort normal and breath sounds normal. She has no wheezes.  Prolonged expiration, minimal end expiratory wheezing  Neurological: She is alert and oriented to person, place, and time.  Skin: Skin is warm and dry.  Psychiatric: Judgment normal.  Vitals reviewed.  Epworth sleepiness flowsheet done on patient with a total of 21. Assessment and Plan :  Sleep disturbance - Plan: Ambulatory referral to Sleep Studies  Hypersomnolence - Plan: Ambulatory referral to Sleep Studies -suspect possible sleep apnea  Smoker -she will continue Chantix and continue her reduction of smoking.  Shortness of breath -better since the start of inhaled medications.  She will call to make the appointment pulmonology for further evaluation of likely COPD.  Patient will follow-up with me in 2 months unless she does not hear from pulmonology.   Windell Hummingbird PA-C  [facility] 04/19/2018 4:58 PM

## 2018-04-19 NOTE — Patient Instructions (Addendum)
[facility] - Address: [address], [place], [place] [address] Phone: [phone]     IF you received an x-ray today, you will receive an invoice from [facility]. Please contact [facility] at [phone] with questions or concerns regarding your invoice.   IF you received labwork today, you will receive an invoice from [facility]. Please contact [facility] at [phone] with questions or concerns regarding your invoice.   Our billing staff will not be able to assist you with questions regarding bills from these companies.  You will be contacted with the lab results as soon as they are available. The fastest way to get your results is to activate your My Chart account. Instructions are located on the last page of this paperwork. If you have not heard from [place] regarding the results in 2 weeks, please contact this office.

## 2018-04-23 ENCOUNTER — Encounter: Payer: Self-pay | Admitting: *Deleted

## 2018-04-24 ENCOUNTER — Other Ambulatory Visit: Payer: Self-pay | Admitting: Physician Assistant

## 2018-04-24 DIAGNOSIS — R0602 Shortness of breath: Secondary | ICD-10-CM

## 2018-04-24 DIAGNOSIS — M7989 Other specified soft tissue disorders: Secondary | ICD-10-CM

## 2018-04-25 ENCOUNTER — Encounter: Payer: Self-pay | Admitting: Neurology

## 2018-04-25 ENCOUNTER — Ambulatory Visit: Payer: PRIVATE HEALTH INSURANCE | Admitting: Neurology

## 2018-04-25 VITALS — BP 158/88 | HR 76 | Ht 65.0 in | Wt 215.0 lb

## 2018-04-25 DIAGNOSIS — R519 Headache, unspecified: Secondary | ICD-10-CM

## 2018-04-25 DIAGNOSIS — F17200 Nicotine dependence, unspecified, uncomplicated: Secondary | ICD-10-CM

## 2018-04-25 DIAGNOSIS — R0602 Shortness of breath: Secondary | ICD-10-CM

## 2018-04-25 DIAGNOSIS — R419 Unspecified symptoms and signs involving cognitive functions and awareness: Secondary | ICD-10-CM | POA: Diagnosis not present

## 2018-04-25 DIAGNOSIS — R51 Headache: Secondary | ICD-10-CM

## 2018-04-25 DIAGNOSIS — R0683 Snoring: Secondary | ICD-10-CM

## 2018-04-25 DIAGNOSIS — J449 Chronic obstructive pulmonary disease, unspecified: Secondary | ICD-10-CM | POA: Diagnosis not present

## 2018-04-25 DIAGNOSIS — R400 Somnolence: Secondary | ICD-10-CM

## 2018-04-25 NOTE — Patient Instructions (Signed)

## 2018-04-25 NOTE — Progress Notes (Signed)
Subjective:    Patient ID: Jennifer Werner is a 57 y.o. female.  HPI     History:   Dear Judson Roch,   I saw your patient, Jennifer Werner, upon your kind request in my neurologic clinic today for initial consultation of her sleep disorder, in particular, concern for underlying obstructive sleep apnea. The patient is unaccompanied today. As you know, Jennifer Werner is a 57 year old right-handed woman with an underlying medical history of COPD, smoking, shortness of breath, lower extremity swelling, and obesity, who reports snoring and excessive daytime somnolence. I reviewed your office note from 04/19/2018. Her Epworth sleepiness score is 21 out of 24, fatigue score is 36 out of 63. She is married and lives with her husband. She has 4 biological children, 3 sons and 1 daughter from her first marriage. She has one stepchild from her second marriage.she is not aware of any family history of OSA. She has been very sleepy in the past year or so. She has also gained weight. She recently saw cardiology for PVCs. 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 She smokes one pack per day, on Chantix, (2 to 2 1/2 ppd before), drinks alcohol 1 drink per day, caffeine 2 servings per day on average. She takes Lasix, 20-40 mg daily. She has had AM HAs, and does not have night to night nocturia. She has had some memory concerns, feeling of sluggishness and foggy headedness. She has come close to falling asleep at the wheel and has to stop and get out of the car sometimes when she drives a longer distance. She had a tonsillectomy at [facility] 76. Bedtime is around 10 and rise time between 6 and 7:30. She has a somewhat flexible work schedule. She has occasional restless leg symptoms and takes 2 Advil at night when necessary which is helpful. She was noted to have oxygen saturations in her 80s recently. She started taking inhalers and feels better. Her oxygen saturations are better. She is scheduled to see pulmonology next  month.  Her Past Medical History Is Significant For: Past Medical History:  Diagnosis Date  . Eczema   . Smoker     Her Past Surgical History Is Significant For: Past Surgical History:  Procedure Laterality Date  . ABLATION     Uterus    Her Family History Is Significant For: Family History  Problem Relation Werner of Onset  . Heart block Mother   . Lymphoma Mother   . Congestive Heart Failure Mother   . Cardiomyopathy Father   . Alcohol abuse Father   . Cardiomyopathy Brother        Had Heart Transplant  . Alcohol abuse Brother   . Cardiomyopathy Paternal Aunt   . Cardiomyopathy Paternal Uncle     Her Social History Is Significant For: Social History   Socioeconomic History  . Marital status: Married    Spouse name: Not on file  . Number of children: Not on file  . Years of education: Not on file  . Highest education level: Not on file  Occupational History  . Not on file  Social Needs  . Financial resource strain: Not on file  . Food insecurity:    Worry: Not on file    Inability: Not on file  . Transportation needs:    Medical: Not on file    Non-medical: Not on file  Tobacco Use  . Smoking status: Current Every Day Smoker    Packs/day: 2.00    Years: 37.00  Pack years: 74.00    Types: Cigarettes  . Smokeless tobacco: Never Used  Substance and Sexual Activity  . Alcohol use: Yes    Comment: 5-7 drinks a week  . Drug use: Never  . Sexual activity: Not on file  Lifestyle  . Physical activity:    Days per week: Not on file    Minutes per session: Not on file  . Stress: Not on file  Relationships  . Social connections:    Talks on phone: Not on file    Gets together: Not on file    Attends religious service: Not on file    Active member of club or organization: Not on file    Attends meetings of clubs or organizations: Not on file    Relationship status: Not on file  Other Topics Concern  . Not on file  Social History Narrative   RN - desk  work mainly   Lives with husband    Her Allergies Are:  Allergies  Allergen Reactions  . Latex Rash  . Codeine   :   Her Current Medications Are:  Outpatient Encounter Medications as of 04/25/2018  Medication Sig  . COMBIVENT RESPIMAT 20-100 MCG/ACT AERS respimat INHALE 1 PUFF INTO THE LUNGS EVERY 6 (SIX) HOURS.  . furosemide (LASIX) 20 MG tablet TAKE 1 TABLET BY MOUTH EVERY DAY  . furosemide (LASIX) 40 MG tablet Take 1 tablet (40 mg total) by mouth daily.  . potassium chloride (K-DUR) 10 MEQ tablet Take 1 tablet (10 mEq total) by mouth daily.  . varenicline (CHANTIX STARTING MONTH PAK) 0.5 MG X 11 & 1 MG X 42 tablet Take 0.5 mg tablet by po qd for 3 days, then 0.5 mg po bid for 4 days, then  1 mg po bid.  [place] Ruts INHUB 500-50 MCG/DOSE AEPB TAKE 1 PUFF BY MOUTH TWICE A DAY   No facility-administered encounter medications on file as of 04/25/2018.   :  Review of Systems:  Out of a complete 14 point review of systems, all are reviewed and negative with the exception of these symptoms as listed below:  Review of Systems  Neurological:       Pt presents today to discuss her sleep. Pt has never had a sleep study but does endorse snoring and daytime sleepiness.  Epworth Sleepiness Scale 0= would never doze 1= slight chance of dozing 2= moderate chance of dozing 3= high chance of dozing  Sitting and reading: 3 Watching TV: 3 Sitting inactive in a public place (ex. Theater or meeting): 3 As a passenger in a car for an hour without a break: 3 Lying down to rest in the afternoon: 3 Sitting and talking to someone:  1 Sitting quietly after lunch (no alcohol): 3 In a car, while stopped in traffic: 2 Total: 21     Objective:  Neurological Exam  Physical Exam Physical Examination:   Vitals:   04/25/18 1316  BP: (!) 158/88  Pulse: 76    General Examination: The patient is a very pleasant 57 y.o. female in no acute distress. She appears well-developed and well-nourished and  well groomed.   HEENT: Normocephalic, atraumatic, pupils are equal, round and reactive to light and accommodation. Extraocular tracking is good without limitation to gaze excursion or nystagmus noted. Normal smooth pursuit is noted. Hearing is grossly intact. Tympanic membranes are clear bilaterally. Face is symmetric with normal facial animation and normal facial sensation. Speech is clear with no dysarthria noted. There is no  hypophonia. There is no lip, neck/head, jaw or voice tremor. Neck is supple with full range of passive and active motion. There are no carotid bruits on auscultation. Oropharynx exam reveals: moderate mouth dryness, adequate dental hygiene and marked airway crowding, due to small airway entry and thicker soft palate. Tonsils are absent. Mallampati is class III. Neck circumference is 17-1/2 inches. Tongue protrudes centrally and palate elevates symmetrically.   Chest: Clear to auscultation without wheezing, rhonchi or crackles noted.  Heart: S1+S2+0, regular and normal without murmurs, rubs or gallops noted.   Abdomen: Soft, non-tender and non-distended with normal bowel sounds appreciated on auscultation.  Extremities: There is trace pitting edema in the distal lower extremities bilaterally.   Skin: Warm and dry without trophic changes noted.  Musculoskeletal: exam reveals no obvious joint deformities, tenderness or joint swelling or erythema.   Neurologically:  Mental status: The patient is awake, alert and oriented in all 4 spheres. Her immediate and remote memory, attention, language skills and fund of knowledge are appropriate. There is no evidence of aphasia, agnosia, apraxia or anomia. Speech is clear with normal prosody and enunciation. Thought process is linear. Mood is normal and affect is normal.  Cranial nerves II - XII are as described above under HEENT exam. In addition: shoulder shrug is normal with equal shoulder height noted. Motor exam: Normal bulk,  strength and tone is noted. There is no tremor or rebound. Fine motor skills and coordination: grossly intact.  Cerebellar testing: No dysmetria or intention tremor on finger to nose testing. Heel to shin is unremarkable bilaterally. There is no truncal or gait ataxia.  Sensory exam: intact to light touch in the upper and lower extremities.  Gait, station and balance: She stands easily. No veering to one side is noted. No leaning to one side is noted. Posture is Werner-appropriate and stance is narrow based. Gait shows normal stride length and normal pace. No problems turning are noted.   Assessment and Plan:  In summary, Jennifer Werner is a very pleasant 56 y.o.-year old female with an underlying medical history of COPD, smoking, shortness of breath, lower extremity swelling, and obesity, whose history and physical exam are concerning for obstructive sleep apnea (OSA). I had a long chat with the patient about my findings and the diagnosis of OSA, its prognosis and treatment options. We talked about medical treatments, surgical interventions and non-pharmacological approaches. I explained in particular the risks and ramifications of untreated moderate to severe OSA, especially with respect to developing cardiovascular disease down the Road, including congestive heart failure, difficult to treat hypertension, cardiac arrhythmias, or stroke. Even type 2 diabetes has, in part, been linked to untreated OSA. Symptoms of untreated OSA include daytime sleepiness, memory problems, mood irritability and mood disorder such as depression and anxiety, lack of energy, as well as recurrent headaches, especially morning headaches. We talked about smoking cessation and trying to maintain a healthy lifestyle in general, as well as the importance of weight control. I encouraged the patient to eat healthy, exercise daily and keep well hydrated, to keep a scheduled bedtime and wake time routine, to not skip any meals and eat  healthy snacks in between meals. I advised the patient not to drive when feeling sleepy. I recommended the following at this time: sleep study with potential positive airway pressure titration. (We will score hypopneas at 3%).   I explained the sleep test procedure to the patient and also outlined possible surgical and non-surgical treatment options of OSA, including  the use of a custom-made dental device (which would require a referral to a specialist dentist or oral surgeon), upper airway surgical options, such as pillar implants, radiofrequency surgery, tongue base surgery, and UPPP (which would involve a referral to an ENT surgeon). Rarely, jaw surgery such as mandibular advancement may be considered.  I also explained the CPAP treatment option to the patient, who indicated that she would be willing to try CPAP if the need arises. I explained the importance of being compliant with PAP treatment, not only for insurance purposes but primarily to improve Her symptoms, and for the patient's long term health benefit, including to reduce Her cardiovascular risks. I answered all her questions today and the patient was in agreement. I would like to see her back after the sleep study is completed and encouraged her to call with any interim questions, concerns, problems or updates.   Thank you very much for allowing me to participate in the care of this nice patient. If I can be of any further assistance to you please do not hesitate to call me at [phone].  Sincerely,   Jennifer Age, MD, PhD

## 2018-04-27 ENCOUNTER — Other Ambulatory Visit: Payer: Self-pay | Admitting: Physician Assistant

## 2018-04-27 DIAGNOSIS — F17200 Nicotine dependence, unspecified, uncomplicated: Secondary | ICD-10-CM

## 2018-04-30 ENCOUNTER — Encounter: Payer: Self-pay | Admitting: Cardiology

## 2018-04-30 ENCOUNTER — Ambulatory Visit: Payer: PRIVATE HEALTH INSURANCE | Admitting: Cardiology

## 2018-04-30 VITALS — BP 130/74 | HR 67 | Ht 64.75 in | Wt 214.1 lb

## 2018-04-30 DIAGNOSIS — J410 Simple chronic bronchitis: Secondary | ICD-10-CM

## 2018-04-30 DIAGNOSIS — R0609 Other forms of dyspnea: Secondary | ICD-10-CM

## 2018-04-30 DIAGNOSIS — F17200 Nicotine dependence, unspecified, uncomplicated: Secondary | ICD-10-CM | POA: Diagnosis not present

## 2018-04-30 NOTE — Patient Instructions (Signed)
Medication Instructions:  Your physician recommends that you continue on your current medications as directed. Please refer to the Current Medication list given to you today.   Labwork: None  Testing/Procedures: None  Follow-Up: Your physician wants you to follow-up in: 3 months. You will receive a reminder letter in the mail two months in advance. If you don't receive a letter, please call our office to schedule the follow-up appointment.   If you need a refill on your cardiac medications before your next appointment, please call your pharmacy.   Thank you for choosing [facility]! Catherine Lockhart, RN [phone]    

## 2018-04-30 NOTE — Progress Notes (Signed)
Cardiology Office Note:    Date:  04/30/2018   ID:  Jennifer Werner, DOB Aug 15, 1961, MRN [id number]  PCP:  [name], PA-C  Cardiologist:  Jenne Campus, MD    Referring MD: [name], PA-C   Chief Complaint  Patient presents with  . 3 week follow up  Doing better  History of Present Illness:    Jennifer Werner is a 57 y.o. female who was referred to me initially because of shortness of breath.  She was seen by her primary care physician and therapy for COPD with bronchodilators as well as diuretics were initiated after that she started feeling much better when I saw her initially she told me she is feeling 80% better.  Just today telling me that she is doing well still has some exertional shortness of breath.  She did have echocardiogram which showed preserved left ventricular ejection fraction.  Interestingly she did have some diastolic dysfunction.  proBNP on her which was only 30.  Denies having any swelling of lower extremities no proximal mitral dyspnea.  Struggling with quitting smoking use Chantix to help does have some dreams but still can tolerate medications she said she used to smoke 2 packs/day now with only 1 pack/day she understands that the ultimate goal will be completely discontinue this bad habit  Past Medical History:  Diagnosis Date  . Eczema   . Smoker     Past Surgical History:  Procedure Laterality Date  . ABLATION     Uterus    Current Medications: Current Meds  Medication Sig  . COMBIVENT RESPIMAT 20-100 MCG/ACT AERS respimat INHALE 1 PUFF INTO THE LUNGS EVERY 6 (SIX) HOURS.  . furosemide (LASIX) 20 MG tablet TAKE 1 TABLET BY MOUTH EVERY DAY  . furosemide (LASIX) 40 MG tablet Take 1 tablet (40 mg total) by mouth daily.  . potassium chloride (K-DUR) 10 MEQ tablet Take 1 tablet (10 mEq total) by mouth daily.  . varenicline (CHANTIX STARTING MONTH PAK) 0.5 MG X 11 & 1 MG X 42 tablet Take 0.5 mg tablet by po qd for 3 days, then 0.5 mg po bid for  4 days, then  1 mg po bid.  Grant Ruts INHUB 500-50 MCG/DOSE AEPB TAKE 1 PUFF BY MOUTH TWICE A DAY     Allergies:   Latex and Codeine   Social History   Socioeconomic History  . Marital status: Married    Spouse name: Not on file  . Number of children: Not on file  . Years of education: Not on file  . Highest education level: Not on file  Occupational History  . Not on file  Social Needs  . Financial resource strain: Not on file  . Food insecurity:    Worry: Not on file    Inability: Not on file  . Transportation needs:    Medical: Not on file    Non-medical: Not on file  Tobacco Use  . Smoking status: Current Every Day Smoker    Packs/day: 2.00    Years: 37.00    Pack years: 74.00    Types: Cigarettes  . Smokeless tobacco: Never Used  Substance and Sexual Activity  . Alcohol use: Yes    Comment: 5-7 drinks a week  . Drug use: Never  . Sexual activity: Not on file  Lifestyle  . Physical activity:    Days per week: Not on file    Minutes per session: Not on file  . Stress: Not on  file  Relationships  . Social connections:    Talks on phone: Not on file    Gets together: Not on file    Attends religious service: Not on file    Active member of club or organization: Not on file    Attends meetings of clubs or organizations: Not on file    Relationship status: Not on file  Other Topics Concern  . Not on file  Social History Narrative   RN - desk work mainly   Lives with husband     Family History: The patient's family history includes Alcohol abuse in her brother and father; Cardiomyopathy in her brother, father, paternal aunt, and paternal uncle; Congestive Heart Failure in her mother; Heart block in her mother; Lymphoma in her mother. ROS:   Please see the history of present illness.    All 14 point review of systems negative except as described per history of present illness  EKGs/Labs/Other Studies Reviewed:      Recent Labs: 04/02/2018: ALT 48;  Hemoglobin 16.6; Platelets 260; TSH 0.886 04/09/2018: BUN 14; Creatinine, Ser 0.67; NT-Pro BNP 30; Potassium 4.4; Sodium 140  Recent Lipid Panel    Component Value Date/Time   CHOL 166 04/02/2018 1222   TRIG 90 04/02/2018 1222   HDL 46 04/02/2018 1222   CHOLHDL 3.6 04/02/2018 1222   LDLCALC 102 (H) 04/02/2018 1222    Physical Exam:    VS:  BP 130/74   Pulse 67   Ht 5' 4.75" (1.645 m)   Wt 214 lb 1.9 oz (97.1 kg)   SpO2 94%   BMI 35.91 kg/m     Wt Readings from Last 3 Encounters:  04/30/18 214 lb 1.9 oz (97.1 kg)  04/25/18 215 lb (97.5 kg)  04/19/18 212 lb 9.6 oz (96.4 kg)     GEN:  Well nourished, well developed in no acute distress HEENT: Normal NECK: No JVD; No carotid bruits LYMPHATICS: No lymphadenopathy CARDIAC: RRR, no murmurs, no rubs, no gallops RESPIRATORY:  Clear to auscultation without rales, wheezing or rhonchi  ABDOMEN: Soft, non-tender, non-distended MUSCULOSKELETAL:  No edema; No deformity  SKIN: Warm and dry LOWER EXTREMITIES: no swelling NEUROLOGIC:  Alert and oriented x 3 PSYCHIATRIC:  Normal affect   ASSESSMENT:    1. Dyspnea on exertion   2. Smoker   3. Simple chronic bronchitis (HCC)    PLAN:    In order of problems listed above:  1. Dyspnea on exertion: Most likely COPD.  I still insist on continuation of diuretics.  She does have some diastolic dysfunction but at the same time her proBNP was normal. 2. Smoking: One 1 pack/day she understands the need to quit and she is working hard from it. 3. COPD with bronchitis.  Followed by internal medicine team.  Obviously the key will be to quit smoking.  See her back in my office in about 3 months or sooner if she has a problem   Medication Adjustments/Labs and Tests Ordered: Current medicines are reviewed at length with the patient today.  Concerns regarding medicines are outlined above.  No orders of the defined types were placed in this encounter.  Medication changes: No orders of the  defined types were placed in this encounter.   Signed, Park Liter, MD, [facility] 04/30/2018 9:06 AM    [place]

## 2018-04-30 NOTE — Telephone Encounter (Signed)
Chantix refill (patient requesting continuing; need diagnosis code sent to pharmacy) Last OV:04/02/18 Last refill:04/02/18 NGI:[id number] Pharmacy: [id number] - [place], [place] - [address] [phone] (Phone) [phone] (Fax)

## 2018-05-02 MED ORDER — VARENICLINE TARTRATE 1 MG PO TABS: 1.0000 mg | ORAL_TABLET | Freq: Two times a day (BID) | ORAL | 0 refills | Status: DC

## 2018-05-15 ENCOUNTER — Ambulatory Visit (INDEPENDENT_AMBULATORY_CARE_PROVIDER_SITE_OTHER): Payer: PRIVATE HEALTH INSURANCE | Admitting: Neurology

## 2018-05-15 DIAGNOSIS — R400 Somnolence: Secondary | ICD-10-CM

## 2018-05-15 DIAGNOSIS — J449 Chronic obstructive pulmonary disease, unspecified: Secondary | ICD-10-CM

## 2018-05-15 DIAGNOSIS — R519 Headache, unspecified: Secondary | ICD-10-CM

## 2018-05-15 DIAGNOSIS — F17200 Nicotine dependence, unspecified, uncomplicated: Secondary | ICD-10-CM

## 2018-05-15 DIAGNOSIS — G4734 Idiopathic sleep related nonobstructive alveolar hypoventilation: Secondary | ICD-10-CM | POA: Diagnosis not present

## 2018-05-15 DIAGNOSIS — R51 Headache: Secondary | ICD-10-CM

## 2018-05-15 DIAGNOSIS — R419 Unspecified symptoms and signs involving cognitive functions and awareness: Secondary | ICD-10-CM

## 2018-05-15 DIAGNOSIS — G4761 Periodic limb movement disorder: Secondary | ICD-10-CM

## 2018-05-15 DIAGNOSIS — R0602 Shortness of breath: Secondary | ICD-10-CM

## 2018-05-15 DIAGNOSIS — R9431 Abnormal electrocardiogram [ECG] [EKG]: Secondary | ICD-10-CM

## 2018-05-15 DIAGNOSIS — R0683 Snoring: Secondary | ICD-10-CM

## 2018-05-15 DIAGNOSIS — G4733 Obstructive sleep apnea (adult) (pediatric): Secondary | ICD-10-CM

## 2018-05-15 DIAGNOSIS — G4720 Circadian rhythm sleep disorder, unspecified type: Secondary | ICD-10-CM

## 2018-05-19 ENCOUNTER — Other Ambulatory Visit: Payer: Self-pay | Admitting: Physician Assistant

## 2018-05-19 DIAGNOSIS — M7989 Other specified soft tissue disorders: Secondary | ICD-10-CM

## 2018-05-19 DIAGNOSIS — R0602 Shortness of breath: Secondary | ICD-10-CM

## 2018-05-20 ENCOUNTER — Telehealth: Payer: Self-pay

## 2018-05-20 NOTE — Telephone Encounter (Signed)
-----   Message from Star Age, MD sent at 05/20/2018  8:21 AM EDT ----- Patient referred by Windell Hummingbird, seen by me on 04/25/18, diagnostic PSG on 05/15/18.   Please call and notify the patient that the recent sleep study showed mild obstructive sleep apnea, but baseline low O2 saturations and pt was placed on O2 supplementation. It is important, that she FU with pulm, quit smoking and work on weight loss. I recommend treatment for OSA in the form of CPAP. This will require a repeat sleep study for proper titration and mask fitting and correct monitoring of the oxygen saturations. Please explain to patient. I have placed an order in the chart. Thanks.  Star Age, MD, PhD [facility])

## 2018-05-20 NOTE — Addendum Note (Signed)
Addended by: Star Age on: 05/20/2018 08:21 AM   Modules accepted: Orders

## 2018-05-20 NOTE — Telephone Encounter (Signed)
Pt returning RN's call.

## 2018-05-20 NOTE — Progress Notes (Signed)
Patient referred by Windell Hummingbird, seen by me on 04/25/18, diagnostic PSG on 05/15/18.   Please call and notify the patient that the recent sleep study showed mild obstructive sleep apnea, but baseline low O2 saturations and pt was placed on O2 supplementation. It is important, that she FU with pulm, quit smoking and work on weight loss. I recommend treatment for OSA in the form of CPAP. This will require a repeat sleep study for proper titration and mask fitting and correct monitoring of the oxygen saturations. Please explain to patient. I have placed an order in the chart. Thanks.  Star Age, MD, PhD [facility])

## 2018-05-20 NOTE — Procedures (Signed)
PATIENT'S NAME:  Jennifer Werner, Jennifer Werner DOB:      August 11, 1961      MR#:    [id number]     DATE OF RECORDING: 05/15/2018 REFERRING M.D.:  Windell Hummingbird, PA Study Performed:   Baseline Polysomnogram HISTORY: 57 year old woman with a history of COPD, smoking, shortness of breath, lower extremity swelling, and obesity, who reports snoring and excessive daytime somnolence. The patient endorsed the Epworth Sleepiness Scale at 21/24 points. The patient's weight 215 pounds with a height of 65 (inches), resulting in a BMI of 36. kg/m2. The patient's neck circumference measured 17.5 inches.  CURRENT MEDICATIONS: Combivent, Lasix, K-Dur, Chantix, Wixela Inhub.   PROCEDURE:  This is a multichannel digital polysomnogram utilizing the Somnostar 11.2 system.  Electrodes and sensors were applied and monitored per AASM Specifications.   EEG, EOG, Chin and Limb EMG, were sampled at 200 Hz.  ECG, Snore and Nasal Pressure, Thermal Airflow, Respiratory Effort, CPAP Flow and Pressure, Oximetry was sampled at 50 Hz. Digital video and audio were recorded.      BASELINE STUDY  Lights Out was at 22:50 and Lights On at 05:08.  Total recording time (TRT) was 379 minutes, with a total sleep time (TST) of  253.5 minutes.   The patient's sleep latency to persistent sleep was 46.5 minutes, which is delayed.  REM latency was 152.5 minutes, which is delayed. The sleep efficiency was 66.9%, which is reduced.     SLEEP ARCHITECTURE: WASO (Wake after sleep onset) was 105.5 minutes with several longer periods of wakefulness. There were 23.5 minutes in Stage N1, 151 minutes Stage N2, 43 minutes Stage N3 and 36 minutes in Stage REM.  The percentage of Stage N1 was 9.3%, which is increased, Stage N2 was 59.6%, which is mildly increased, Stage N3 was 17.%, which is normal, and Stage R (REM sleep) was 14.2%, which is mildly reduced. The arousals were noted as: 21 were spontaneous, 13 were associated with PLMs, 22 were associated with respiratory events.    RESPIRATORY ANALYSIS:  There were a total of 35 respiratory events:  29 obstructive apneas, 0 central apneas and 0 mixed apneas with a total of 29 apneas and an apnea index (AI) of 6.9 /hour. There were 6 hypopneas with a hypopnea index of 1.4 /hour. The patient also had 0 respiratory event related arousals (RERAs).      The total APNEA/HYPOPNEA INDEX (AHI) was 8.3/hour and the total RESPIRATORY DISTURBANCE INDEX was 0. 8.3 /hour.  21 events occurred in REM sleep and 4 events in NREM. The REM AHI was 35/hour, versus a non-REM AHI of 3.9. The patient spent 29 minutes of total sleep time in the supine position and 225 minutes in non-supine.. The supine AHI was 18.7/hour versus a non-supine AHI of 6.9.  OXYGEN SATURATION & C02:  The Wake baseline 02 saturation was 91%, with the lowest being 81%. Time spent below 89% saturation equaled 39 minutes. Of note, the patient was placed on supplemental oxygen (23:44, epoch 117 at 1 lpm) due to lower oxygen saturations in the 85% range, during sleep, in the absence of obstructive respiratory events. O2 was increased to 3 lpm, but then reduced to 2 lpm to ensure oxygen saturation above 90%.   PERIODIC LIMB MOVEMENTS: The patient had a total of 192 Periodic Limb Movements.  The Periodic Limb Movement (PLM) index was 45.4 and the PLM Arousal index was 3.1/hour.  Audio and video analysis did not show any abnormal or unusual movements, behaviors, phonations or vocalizations.  The patient took no bathroom breaks. Mild to moderate snoring was noted. The EKG was in keeping with normal sinus rhythm (NSR) with occasional PVCs noted.   Post-study, the patient indicated that sleep was the same as usual.   IMPRESSION:  1. Oxygen desaturations in sleep 2. Obstructive Sleep Apnea (OSA) 3. Periodic Limb Movement Disorder (PLMD) 4. Dysfunctions associated with sleep stages or arousals from sleep  5. Non-specific abnormal EKG  RECOMMENDATIONS:  1. This study demonstrates  overall mild obstructive sleep apnea, more pronounced in REM and during supine sleep, with a total AHI of 8.3/hour, REM AHI of 35/hour, supine AHI of 18.7/hour, and O2 nadir of 81%. The patient had lower oxygen saturations during sleep and wakefulness in the mid-80s. Supplemental O2 was used during this study to ensure oxygen saturations above 90%. The patient will be advised to seek consultation with pulmonology; smoking cessation and weight loss should be pursued aggressively.  2. Given the patient's medical history and sleep related complaints, treatment of her mild OSA with positive airway pressure is reasonable. A full-night CPAP titration study is recommended to optimize therapy. Other treatment options may include avoidance of supine sleep position along with weight loss, upper airway or jaw surgery in selected patients or the use of an oral appliance in certain patients. ENT evaluation and/or consultation with a maxillofacial surgeon or dentist may be feasible in some instances.    3. Please note that untreated obstructive sleep apnea may carry additional perioperative morbidity. Patients with significant obstructive sleep apnea (typically, in the moderate to severe degree) should receive, if possible, perioperative PAP (positive airway pressure) therapy and the surgeons and particularly the anesthesiologists should be informed of the diagnosis and the severity of the sleep disordered breathing. If feasible, the patient may be asked to bring his/her CPAP or BiPAP machine for a planned/elective surgery.  4. This study shows sleep fragmentation and abnormal sleep stage percentages; these are nonspecific findings and per se do not signify an intrinsic sleep disorder or a cause for the patient's sleep-related symptoms. Causes include (but are not limited to) the first night effect of the sleep study, circadian rhythm disturbances, medication effect or an underlying mood disorder or medical problem.   5. Moderate PLMs (periodic limb movements of sleep) were noted during this study with minimal arousals; clinical correlation is recommended. 6. The study showed occasional PVCs on single lead EKG; clinical correlation is recommended and consultation with cardiology may be feasible.  7. The patient should be cautioned not to drive, work at heights, or operate dangerous or heavy equipment when tired or sleepy. Review and reiteration of good sleep hygiene measures should be pursued with any patient. 8. The patient will be seen in follow-up in the sleep clinic at [facility] for discussion of the test results, symptom and treatment compliance review, further management strategies, etc. The referring provider will be notified of the test results.  I certify that I have reviewed the entire raw data recording prior to the issuance of this report in accordance with the Standards of Accreditation of the American Academy of Sleep Medicine (AASM)     Star Age, MD, PhD Diplomat, American Board of Psychiatry and Neurology (Neurology and Sleep Medicine)

## 2018-05-20 NOTE — Telephone Encounter (Signed)
I called pt. I advised pt that Dr. Rexene Alberts reviewed their sleep study results and found that pt has mild osa with lower O2 and recommends that pt be treated with a cpap. Dr. Rexene Alberts recommends that pt return for a repeat sleep study in order to properly titrate the cpap and ensure a good mask fit. Pt is agreeable to returning for a titration study. I advised pt that our sleep lab will file with pt's insurance and call pt to schedule the sleep study when we hear back from the pt's insurance regarding coverage of this sleep study. Pt has an appt with pulmonology tomorrow. I advised her to stop smoking and work on weight loss.Pt verbalized understanding of results. Pt had no questions at this time but was encouraged to call back if questions arise.

## 2018-05-20 NOTE — Telephone Encounter (Signed)
I called pt to discuss her sleep study results, no answer, left a message asking her to call me back. 

## 2018-05-21 ENCOUNTER — Ambulatory Visit: Payer: PRIVATE HEALTH INSURANCE | Admitting: Internal Medicine

## 2018-05-21 ENCOUNTER — Encounter: Payer: Self-pay | Admitting: Internal Medicine

## 2018-05-21 VITALS — BP 124/68 | HR 71 | Ht 65.0 in | Wt 216.0 lb

## 2018-05-21 DIAGNOSIS — J449 Chronic obstructive pulmonary disease, unspecified: Secondary | ICD-10-CM

## 2018-05-21 DIAGNOSIS — F17210 Nicotine dependence, cigarettes, uncomplicated: Secondary | ICD-10-CM | POA: Diagnosis not present

## 2018-05-21 MED ORDER — BUDESONIDE-FORMOTEROL FUMARATE 160-4.5 MCG/ACT IN AERO: INHALATION_SPRAY | RESPIRATORY_TRACT | 11 refills | Status: DC

## 2018-05-21 MED ORDER — BUDESONIDE-FORMOTEROL FUMARATE 160-4.5 MCG/ACT IN AERO: 2.0000 | INHALATION_SPRAY | Freq: Two times a day (BID) | RESPIRATORY_TRACT | 0 refills | Status: DC

## 2018-05-21 NOTE — Patient Instructions (Addendum)
The key is to stop smoking completely before smoking completely stops you - good luck!  Plan A = Automatic = Symbicort 160 Take 2 puffs first thing in am and then another 2 puffs about 12 hours later.    Work on inhaler technique:  relax and gently blow all the way out then take a nice smooth deep breath back in, triggering the inhaler at same time you start breathing in.  Hold for up to 5 seconds if you can. Blow out thru nose. Rinse and gargle with water when done     Plan B = Backup Only use your combivent as a rescue medication to be used if you can't catch your breath by resting or doing a relaxed purse lip breathing pattern.  - The less you use it, the better it will work when you need it. - Ok to use the inhaler up to 1 puffs  every 4 hours if you must but call for appointment if use goes up over your usual need - Don't leave home without it !!  (think of it like the spare tire for your car)     Please schedule a follow up visit in 3 months but call sooner if needed  With full pfts on return

## 2018-05-21 NOTE — Progress Notes (Signed)
Subjective:     Patient ID: Jennifer Werner, female   DOB: [birth year]    MRN: [id number]  HPI 32 yowf NP active smoker a bit less energetic as child than peers surrounded to smokers and  with tendency to cough as long as she can remember ? worse in winter with baseline 120 lf then doe x 2017 worse since winter of 2019 rx with advair 500 dose /combivent and prednisone > 50% back to baseline so referred to pulmonary clinic 05/21/2018 by Windell Hummingbird.   Sleep study 05/15/18 c/w AHI 8 but desats > plan cpap titration byDr Rexene Alberts    05/21/2018 1st [facility] office visit/ Eldrige Pitkin   Chief Complaint  Patient presents with  . Pulmonary Consult    Referred by Windell Hummingbird, PA for eval of hypoxia.  She states she has been having SOB since Winter 2017. She states she presented to her PCP with SOB in April 2019 and had o2 sat of 88%ra. She states she gets SOB walking for approx 5-10 min.  She has occ cough, prod in the am after inhaler with tan sputum.    sleeps horizontal rotated on L side wakes up each am feeling tired / hits snooze button twice /some am cough/ congestion and then uses combivent x one and w/in 15 min better then later on in the am finally gets around to taking the advair 500. Breathing worse in hot weather. Cough worse around strong odors   Doe = MMRC2 = can't walk a nl pace on a flat grade s sob but does fine slow and flat eg shopping ok    No obvious day to day or daytime variability or assoc   mucus plugs or hemoptysis or cp or chest tightness, subjective wheeze or overt sinus or hb symptoms. No unusual exposure hx or h/o childhood pna/ asthma or knowledge of premature birth.   Also denies any obvious fluctuation of symptoms with weather or environmental changes or other aggravating or alleviating factors except as outlined above   Current Allergies, Complete Past Medical History, Past Surgical History, Family History, and Social History were reviewed in [employer] record.  ROS  The following are not active complaints unless bolded Hoarseness, sore throat, dysphagia, dental problems, itching, sneezing,  nasal congestion or discharge of excess mucus or purulent secretions, ear ache,   fever, chills, sweats, unintended wt loss or wt gain, classically pleuritic or exertional cp,  orthopnea pnd or arm/hand swelling  or leg swelling, presyncope, palpitations, abdominal pain, anorexia, nausea, vomiting, diarrhea  or change in bowel habits or change in bladder habits, change in stools or change in urine, dysuria, hematuria,  rash, arthralgias, visual complaints, headache, numbness, weakness or ataxia or problems with walking or coordination,  change in mood or  memory.        Current Meds  Medication Sig  . COMBIVENT RESPIMAT 20-100 MCG/ACT AERS respimat INHALE 1 PUFF INTO THE LUNGS EVERY 6 (SIX) HOURS.  . furosemide (LASIX) 20 MG tablet TAKE 1 TABLET BY MOUTH EVERY DAY  . ibuprofen (ADVIL,MOTRIN) 200 MG tablet Take 200 mg by mouth every 6 (six) hours as needed.  . potassium chloride (K-DUR) 10 MEQ tablet Take 1 tablet (10 mEq total) by mouth daily.  . varenicline (CHANTIX CONTINUING MONTH PAK) 1 MG tablet Take 1 tablet (1 mg total) by mouth 2 (two) times daily.  Grant Ruts INHUB 500-50 MCG/DOSE AEPB TAKE 1 PUFF BY MOUTH TWICE A DAY  Review of Systems     Objective:   Physical Exam    obese pleasant amb wf nad   Wt Readings from Last 3 Encounters:  05/21/18 216 lb (98 kg)  04/30/18 214 lb 1.9 oz (97.1 kg)  04/25/18 215 lb (97.5 kg)     Vital signs reviewed - Note on arrival 02 sats  96% on RA     HEENT: nl dentition, turbinates bilaterally, and oropharynx. Nl external ear canals without cough reflex   NECK :  without JVD/Nodes/TM/ nl carotid upstrokes bilaterally   LUNGS: no acc muscle use,  Nl contour chest which is clear to A and P bilaterally without cough on insp or exp maneuvers   CV:  RRR  no s3 or murmur or increase in  P2, and no edema   ABD:  Obese/ soft and nontender with nl inspiratory excursion in the supine position. No bruits or organomegaly appreciated, bowel sounds nl  MS:  Nl gait/ ext warm without deformities, calf tenderness, cyanosis or clubbing No obvious joint restrictions   SKIN: warm and dry without lesions    NEURO:  alert, approp, nl sensorium with  no motor or cerebellar deficits apparent.      I personally reviewed images and agree with radiology impression as follows:  CXR:   04/02/18 1. No active lung disease. Probable linear scarring in the region of the lingula. 2. Borderline cardiomegaly.    Assessment:

## 2018-05-21 NOTE — Assessment & Plan Note (Signed)
4-5 min discussion re active cigarette smoking in addition to office E&M  Ask about tobacco use:  Ongoing, down to 10 per day Advise quitting:    I emphasized that although we never turn away smokers from the pulmonary clinic, we do ask that they understand that the recommendations that we make  won't work nearly as well in the presence of continued cigarette exposure.  In fact, we may very well  reach a point where we can't promise to help the patient if he/she can't quit smoking. (We can and will promise to try to help, we just can't promise what we recommend will really work)  Assess willingness yes/ ongoing Assist in quit attempt on chantix per pcp Arrange follow up. Per pcp

## 2018-05-21 NOTE — Assessment & Plan Note (Addendum)
05/21/2018  Walked RA x 3 laps @ 185 ft each stopped due to  End of study, fast pace, no desat but sob at end of study     Spirometry 05/21/2018  FEV1 1.22 (45%)  Ratio 58 p am advair 500 - 05/21/2018  After extensive coaching inhaler device  effectiveness =    75% (short Ti, delayed inspiration p trigger )     DDX of  difficult airways management almost all start with A and  include Adherence, Ace Inhibitors, Acid Reflux, Active Sinus Disease, Alpha 1 Antitripsin deficiency, Anxiety masquerading as Airways dz,  ABPA,  Allergy(esp in young), Aspiration (esp in elderly), Adverse effects of meds,  Active smokers, A bunch of PE's (a small clot burden can't cause this syndrome unless there is already severe underlying pulm or vascular dz with poor reserve) plus two Bs  = Bronchiectasis and Beta blocker use..and one C= CHF   Adherence is always the initial "prime suspect" and is a multilayered concern that requires a "trust but verify" approach in every patient - starting with knowing how to use medications, especially inhalers, correctly, keeping up with refills and understanding the fundamental difference between maintenance and prns vs those medications only taken for a very short course and then stopped and not refilled.  - see hfa teaching    Active smoking greatest concern - see sep a/p   ? Adverse effects of dpi > try hfa symb x 2 week sample  ? Allergy/ab component > start with symb 160 2bid to replace the advair then return for pfts and consider change to lama/laba if no real difference as may not actually be much of as asthmatic component here  ? Acid (or non-acid) GERD > always difficult to exclude as up to 75% of pts in some series report no assoc GI/ Heartburn symptoms> rec consider next add max (24h)  acid suppression if cough not better on symbicort as may be more gerd related than AB or components of both.     Total time devoted to counseling  > 50 % of initial 60 min office visit:   review case with pt/ discussion of options/alternatives/ personally creating written customized instructions  in presence of pt  then going over those specific  Instructions directly with the pt including how to use all of the meds but in particular covering each new medication in detail and the difference between the maintenance= "automatic" meds and the prns using an action plan format for the latter (If this problem/symptom => do that organization reading Left to right).  Please see AVS from this visit for a full list of these instructions which I personally wrote for this pt and  are unique to this visit.   See device teaching which extended face to face time for this visit

## 2018-05-27 ENCOUNTER — Encounter: Payer: Self-pay | Admitting: Internal Medicine

## 2018-05-27 LAB — HM MAMMOGRAPHY

## 2018-05-29 ENCOUNTER — Ambulatory Visit (HOSPITAL_COMMUNITY)
Admission: RE | Admit: 2018-05-29 | Discharge: 2018-05-29 | Disposition: A | Discharge status: Home / Self Care | Payer: PRIVATE HEALTH INSURANCE | Source: Ambulatory Visit | Attending: Vascular Surgery | Admitting: Vascular Surgery

## 2018-05-29 ENCOUNTER — Encounter: Payer: Self-pay | Admitting: Vascular Surgery

## 2018-05-29 ENCOUNTER — Ambulatory Visit (INDEPENDENT_AMBULATORY_CARE_PROVIDER_SITE_OTHER): Payer: PRIVATE HEALTH INSURANCE | Admitting: Vascular Surgery

## 2018-05-29 VITALS — BP 132/75 | HR 76 | Temp 98.5°F | Ht 65.0 in | Wt 218.0 lb

## 2018-05-29 DIAGNOSIS — R609 Edema, unspecified: Secondary | ICD-10-CM | POA: Insufficient documentation

## 2018-05-29 DIAGNOSIS — I83893 Varicose veins of bilateral lower extremities with other complications: Secondary | ICD-10-CM

## 2018-05-29 DIAGNOSIS — I872 Venous insufficiency (chronic) (peripheral): Secondary | ICD-10-CM

## 2018-05-29 DIAGNOSIS — M7989 Other specified soft tissue disorders: Secondary | ICD-10-CM

## 2018-05-29 HISTORY — DX: Varicose veins of bilateral lower extremities with other complications: I83.893

## 2018-05-29 HISTORY — DX: Venous insufficiency (chronic) (peripheral): I87.2

## 2018-05-29 NOTE — Progress Notes (Signed)
Requested by:  [name], PA-C [address] [place], [place] [address]  Reason for consultation: bilateral leg swelling    History of Present Illness   Jennifer Werner is a 57 y.o. (16-Feb-1961) female who presents with chief complaint: bilateral leg swelling.  Patient notes, onset of swelling years ago, associated with no obvious trigger.  The patient's symptoms include: swelling with extended standing, burning sensation and itching.  The patient has had some sx relief with Lasix use.  The patient has had no history of DVT, known history of pregnancy, known history of varicose vein, no history of venous stasis ulcers, no history of  Lymphedema and no history of skin changes in lower legs.  There is no family history of venous disorders.  The patient has used knee high compression stockings in the past.  Past Medical History:  Diagnosis Date  . Eczema   . Smoker     Past Surgical History:  Procedure Laterality Date  . ABLATION     Uterus    Social History   Socioeconomic History  . Marital status: Married    Spouse name: Not on file  . Number of children: Not on file  . Years of education: Not on file  . Highest education level: Not on file  Occupational History  . Not on file  Social Needs  . Financial resource strain: Not on file  . Food insecurity:    Worry: Not on file    Inability: Not on file  . Transportation needs:    Medical: Not on file    Non-medical: Not on file  Tobacco Use  . Smoking status: Current Every Day Smoker    Packs/day: 2.00    Years: 47.00    Pack years: 94.00    Types: Cigarettes  . Smokeless tobacco: Never Used  Substance and Sexual Activity  . Alcohol use: Yes    Comment: 5-7 drinks a week  . Drug use: Never  . Sexual activity: Not on file  Lifestyle  . Physical activity:    Days per week: Not on file    Minutes per session: Not on file  . Stress: Not on file  Relationships  . Social connections:    Talks on phone: Not on  file    Gets together: Not on file    Attends religious service: Not on file    Active member of club or organization: Not on file    Attends meetings of clubs or organizations: Not on file    Relationship status: Not on file  . Intimate partner violence:    Fear of current or ex partner: Not on file    Emotionally abused: Not on file    Physically abused: Not on file    Forced sexual activity: Not on file  Other Topics Concern  . Not on file  Social History Narrative   RN - desk work mainly   Lives with husband    Family History  Problem Relation Age of Onset  . Heart block Mother   . Lymphoma Mother   . Congestive Heart Failure Mother   . Cardiomyopathy Father   . Alcohol abuse Father   . Cardiomyopathy Brother        Had Heart Transplant  . Alcohol abuse Brother   . Asthma Brother   . Cardiomyopathy Paternal Aunt   . Cardiomyopathy Paternal Uncle   . Rheum arthritis Paternal Grandmother     Current Outpatient Medications  Medication  Sig Dispense Refill  . budesonide-formoterol (SYMBICORT) 160-4.5 MCG/ACT inhaler Take 2 puffs first thing in am and then another 2 puffs about 12 hours later. 1 Inhaler 11  . COMBIVENT RESPIMAT 20-100 MCG/ACT AERS respimat INHALE 1 PUFF INTO THE LUNGS EVERY 6 (SIX) HOURS. 12 g 0  . furosemide (LASIX) 20 MG tablet TAKE 1 TABLET BY MOUTH EVERY DAY 30 tablet 0  . ibuprofen (ADVIL,MOTRIN) 200 MG tablet Take 200 mg by mouth every 6 (six) hours as needed.    . potassium chloride (K-DUR) 10 MEQ tablet Take 1 tablet (10 mEq total) by mouth daily. 90 tablet 3  . varenicline (CHANTIX CONTINUING MONTH PAK) 1 MG tablet Take 1 tablet (1 mg total) by mouth 2 (two) times daily. (Patient not taking: Reported on 05/29/2018) 180 tablet 0  . WIXELA INHUB 500-50 MCG/DOSE AEPB TAKE 1 PUFF BY MOUTH TWICE A DAY 60 each 0   No current facility-administered medications for this visit.     Allergies  Allergen Reactions  . Latex Rash  . Codeine     REVIEW OF  SYSTEMS (negative unless checked):   Cardiac:  []  Chest pain or chest pressure? [x]  Shortness of breath upon activity? []  Shortness of breath when lying flat? []  Irregular heart rhythm?  Vascular:  []  Pain in calf, thigh, or hip brought on by walking? [x]  Pain in feet at night that wakes you up from your sleep? []  Blood clot in your veins? [x]  Leg swelling?  Pulmonary:  []  Oxygen at home? []  Productive cough? []  Wheezing?  Neurologic:  []  Sudden weakness in arms or legs? []  Sudden numbness in arms or legs? []  Sudden onset of difficult speaking or slurred speech? []  Temporary loss of vision in one eye? []  Problems with dizziness?  Gastrointestinal:  []  Blood in stool? []  Vomited blood?  Genitourinary:  []  Burning when urinating? []  Blood in urine?  Psychiatric:  []  Major depression  Hematologic:  []  Bleeding problems? []  Problems with blood clotting?  Dermatologic:  [x]  Rashes or ulcers?  Constitutional:  []  Fever or chills?  Ear/Nose/Throat:  []  Change in hearing? []  Nose bleeds? []  Sore throat?  Musculoskeletal:  []  Back pain? []  Joint pain? []  Muscle pain?   Physical Examination     Vitals:   05/29/18 1506  BP: 132/75  Pulse: 76  Temp: 98.5 F (36.9 C)  TempSrc: Oral  SpO2: 92%  Weight: 218 lb (98.9 kg)  Height: 5\' 5"  (1.651 m)   Body mass index is 36.28 kg/m.  General Alert, O x 3, WD, NAD  Head [place]/AT,    Ear/Nose/ Throat Hearing grossly intact, nares without erythema or drainage,   Eyes PERRLA, EOMI,    Neck Supple, mid-line trachea,    Pulmonary Sym exp, good B air movt, CTA B  Cardiac RRR, Nl S1, S2, no Murmurs, No rubs, No S3,S4  Vascular Vessel Right Left  Radial Palpable Palpable  Brachial Palpable Palpable  Carotid Palpable, No Bruit Palpable, No Bruit  Aorta Not palpable N/A  Femoral Palpable Palpable  Popliteal Not palpable Not palpable  PT Faintly palpable Faintly palpable  DP Palpable Palpable     Gastro- intestinal soft, non-distended, non-tender to palpation, No guarding or rebound, no HSM, no masses, no CVAT B, No palpable prominent aortic pulse,    Musculo- skeletal M/S 5/5 throughout  , Extremities without ischemic changes  , Non-pitting edema present: 1-2+ B, Varicosities present: faintly evident, No Lipodermatosclerosis present  Neurologic Cranial nerves grossly intact ,  Pain and light touch intact in extremities , Motor exam as listed above  Psychiatric Judgement intact, Mood & affect appropriate for pt's clinical situation  Dermatologic See M/S exam for extremity exam, No rashes otherwise noted  Lymphatic  Palpable lymph nodes: None    Non-invasive Vascular Imaging   BLE Venous Insufficiency Duplex (05/29/2018):   RLE:   no DVT and SVT,   + GSV reflux: 2.2-5.8 mm  no SSV reflux,  + deep venous reflux: CFV  LLE:  no DVT and SVT,   + GSV reflux: 1.8-5.5 mm  no SSV reflux,  + deep venous reflux: CFV   Outside Studies/Documentation   4 pages of outside documents were reviewed including: outpatient PCP chart.   Medical Decision Making   SOUL DEVENEY is a 57 y.o. female who presents with: BLE chronic venous insufficiency (C3), varicose veins with complications   Based on the patient's history and examination, I recommend: compressive therapy.  I discussed with the patient the use of her 20-30 mm thigh high compression stockings and need for 3 month trial of such.  The patient will follow up in 3 months with my partners in the [facility] for evaluation for: R vs L GSV EVLA.  Thank you for allowing [place] to participate in this patient's care.   Adele Barthel, MD, FACS [facility] Office: [phone] Pager: [phone]  05/29/2018, 3:30 PM

## 2018-06-04 ENCOUNTER — Encounter: Payer: Self-pay | Admitting: Physician Assistant

## 2018-06-04 ENCOUNTER — Telehealth: Payer: Self-pay | Admitting: *Deleted

## 2018-06-04 NOTE — Telephone Encounter (Signed)
Pt received a bill from [facility] saying that the visit with you is not covered because you coded it for smoking cessation. She stated she was referred to you for shortness of breath. Is there any way you can add a code or shortness of breath and submit it again. Thanks  [employer]

## 2018-06-04 NOTE — Telephone Encounter (Signed)
So, Dx for that visit is DOE,  Smoking  Bronchitis  is that's not sufficient? What should I do to change it?

## 2018-06-05 ENCOUNTER — Telehealth: Payer: Self-pay

## 2018-06-05 NOTE — Telephone Encounter (Signed)
Jennifer Werner says you need to be more specific in your diagnosis. Apparently smoking was the primary Dx. Can you resubmit with DOE as the primary and be more specific with the smoking dx

## 2018-06-05 NOTE — Telephone Encounter (Signed)
After three attempts of calling pt to schedule CPAP study, pt stated on 06/03/18 that she will call back to schedule when her schedule allows it.

## 2018-06-05 NOTE — Telephone Encounter (Signed)
Noted, thanks!

## 2018-06-05 NOTE — Telephone Encounter (Signed)
Opened in error

## 2018-06-19 ENCOUNTER — Encounter: Payer: Self-pay | Admitting: *Deleted

## 2018-06-21 ENCOUNTER — Ambulatory Visit: Payer: PRIVATE HEALTH INSURANCE | Admitting: Physician Assistant

## 2018-06-21 ENCOUNTER — Encounter: Payer: Self-pay | Admitting: Physician Assistant

## 2018-06-21 ENCOUNTER — Other Ambulatory Visit: Payer: Self-pay

## 2018-06-21 VITALS — BP 128/70 | HR 70 | Temp 99.5°F | Resp 18 | Ht 65.0 in | Wt 216.0 lb

## 2018-06-21 DIAGNOSIS — Z114 Encounter for screening for human immunodeficiency virus [HIV]: Secondary | ICD-10-CM

## 2018-06-21 DIAGNOSIS — Z1211 Encounter for screening for malignant neoplasm of colon: Secondary | ICD-10-CM | POA: Diagnosis not present

## 2018-06-21 DIAGNOSIS — Z1159 Encounter for screening for other viral diseases: Secondary | ICD-10-CM

## 2018-06-21 DIAGNOSIS — Z23 Encounter for immunization: Secondary | ICD-10-CM | POA: Diagnosis not present

## 2018-06-21 DIAGNOSIS — H02823 Cysts of right eye, unspecified eyelid: Secondary | ICD-10-CM | POA: Diagnosis not present

## 2018-06-21 DIAGNOSIS — R7989 Other specified abnormal findings of blood chemistry: Secondary | ICD-10-CM

## 2018-06-21 DIAGNOSIS — R945 Abnormal results of liver function studies: Secondary | ICD-10-CM

## 2018-06-21 NOTE — Progress Notes (Signed)
Jennifer Werner  MRN: [id number] DOB: July 31, 1961  PCP: [name], PA-C  Chief Complaint  Patient presents with  . Medication Refill    med check     Subjective:  Pt presents to clinic for med recheck and discussion on what has been going on.  Cardiologist updated good echo not likely to have congestive heart failure.  She has follow-up planned in 3 months.  Pulmonologist has placed her on Symbicort and her breathing is significantly better.  She uses Combivent very rarely.  She has stopped smoking 3 weeks ago without using Chantix as it made her nauseated.  She has purchased compression socks but has not started wearing them.  Her swelling has almost resolved.  She still does use Lasix.  She has plenty at home as it was given her by the cardiologist along with potassium.  Her labs were reviewed from last visit which showed an elevated liver enzyme.  And she has not had her labs checked for potassium since starting on the Lasix.   She has a cyst on the side of right nose at the corner of her eye which is starting to bother her due to wearing glasses as well as creating a shadow for vision it does not bother her.  But it has grown in size.  She does have history of multiple sites of cyst on her body.  She would like to see a dermatologist and have this removed.  She would also like to have some health maintenance done today and she also knows that she needs a physical but is unsure the timeframe of this.  History is obtained by patient.  Review of Systems  Constitutional: Negative for chills and fever.  Respiratory: Positive for shortness of breath (Significantly improved, now happens with physical activity).   Cardiovascular: Positive for leg swelling (Resolved, but taking Lasix). Negative for chest pain and palpitations.    Patient Active Problem List   Diagnosis Date Noted  . Chronic venous insufficiency 05/29/2018  . Varicose veins of bilateral lower extremities with other  complications 25/49/8264  . Dyspnea on exertion 04/09/2018  . COPD GOLD III/ still smoking  04/09/2018  . Cigarette smoker 04/02/2018  . Plantar fasciitis of left foot 03/04/2014  . Metatarsal deformity 03/04/2014  . Equinus deformity of foot, acquired 03/04/2014    Current Outpatient Medications on File Prior to Visit  Medication Sig Dispense Refill  . budesonide-formoterol (SYMBICORT) 160-4.5 MCG/ACT inhaler Take 2 puffs first thing in am and then another 2 puffs about 12 hours later. 1 Inhaler 11  . COMBIVENT RESPIMAT 20-100 MCG/ACT AERS respimat INHALE 1 PUFF INTO THE LUNGS EVERY 6 (SIX) HOURS. 12 g 0  . furosemide (LASIX) 20 MG tablet TAKE 1 TABLET BY MOUTH EVERY DAY 30 tablet 0  . potassium chloride (K-DUR) 10 MEQ tablet Take 1 tablet (10 mEq total) by mouth daily. 90 tablet 3   No current facility-administered medications on file prior to visit.     Allergies  Allergen Reactions  . Latex Rash  . Codeine     Past Medical History:  Diagnosis Date  . Eczema   . 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 Smoking status: Former Smoker    Packs/day: 2.00    Years: 47.00    Pack years: 94.00    Types: Cigarettes  Last attempt to quit: 05/31/2018    Years since quitting: 0.0  . Smokeless tobacco: Never Used  Substance Use Topics  . Alcohol use: Yes    Comment: 5-7 drinks a week  . Drug use: Never   family history includes Alcohol abuse in her brother and father; Asthma in her brother; Cardiomyopathy in her brother, father, paternal aunt, and paternal uncle; Congestive Heart Failure in her mother; Heart block in her mother; Lymphoma in her mother; Rheum arthritis in her paternal grandmother.     Objective:  BP 128/70   Pulse 70   Temp 99.5 F (37.5 C) (Oral)   Resp 18   Ht _0  (1.651 m)   Wt 216 lb (98 kg)   SpO2 92%   BMI 35.94 kg/m  Body mass index is 35.94  kg/m.  Wt Readings from Last 3 Encounters:  06/21/18 216 lb (98 kg)  05/29/18 218 lb (98.9 kg)  05/21/18 216 lb (98 kg)    Physical Exam  Constitutional: She is oriented to person, place, and time. She appears well-developed and well-nourished.  HENT:  Head: Normocephalic and atraumatic.    Right Ear: Hearing and external ear normal.  Left Ear: Hearing and external ear normal.  Eyes: Conjunctivae are normal.  Neck: Normal range of motion.  Cardiovascular: Normal rate, regular rhythm and normal heart sounds.  No murmur heard. Pulmonary/Chest: Effort normal and breath sounds normal. She has no wheezes.  Neurological: She is alert and oriented to person, place, and time.  Skin: Skin is warm and dry.  Psychiatric: Judgment normal.  Vitals reviewed.   Assessment and Plan :  Elevated LFTs - Plan: CMP14+EGFR, CANCELED: Hepatic Function Panel -check labs to see if resolved no history of elevated liver enzymes in the past  Cyst of right eyelid - Plan: Ambulatory referral to Dermatology -suspect this to be a sebaceous cyst but due to location would like a specialist to remove  Encounter for hepatitis C screening test for low risk patient - Plan: HCV Ab w Reflex to Quant PCR  Screening for HIV (human immunodeficiency virus) - Plan: HIV antibody  Need for Tdap vaccination - Plan: Tdap vaccine greater than or equal to 7yo IM  Screen for colon cancer - Plan: Cologuard  Patient will figure out when her complete physical needs to happen and then schedule that with me.  Otherwise she will follow-up with me in 6 months at the new clinic.  Patient verbalized to me that they understand the following: diagnosis, what is being done for them, what to expect and what should be done at home.  Their questions have been answered.  See after visit summary for patient specific instructions.  Windell Hummingbird PA-C  [facility] 06/21/2018 5:25 PM  Please note:  Portions of this report may have been transcribed using dragon voice recognition software. Every effort was made to ensure accuracy; however, inadvertent computerized transcription errors may be present.

## 2018-06-21 NOTE — Patient Instructions (Addendum)
[place], [place], [place] [address] Phone: [phone]  6 months unless you need a CPE before then    IF you received an x-ray today, you will receive an invoice from [facility]. Please contact [facility] at [phone] with questions or concerns regarding your invoice.   IF you received labwork today, you will receive an invoice from [facility]. Please contact [facility] at [phone] with questions or concerns regarding your invoice.   Our billing staff will not be able to assist you with questions regarding bills from these companies.  You will be contacted with the lab results as soon as they are available. The fastest way to get your results is to activate your My Chart account. Instructions are located on the last page of this paperwork. If you have not heard from [place] regarding the results in 2 weeks, please contact this office.

## 2018-06-24 ENCOUNTER — Telehealth: Payer: Self-pay

## 2018-06-24 ENCOUNTER — Telehealth: Payer: Self-pay | Admitting: Physician Assistant

## 2018-06-24 DIAGNOSIS — R7989 Other specified abnormal findings of blood chemistry: Secondary | ICD-10-CM

## 2018-06-24 DIAGNOSIS — R945 Abnormal results of liver function studies: Principal | ICD-10-CM

## 2018-06-24 NOTE — Telephone Encounter (Signed)
Received call report form [place] for [facility] CO2 15  Routing to office.

## 2018-06-24 NOTE — Telephone Encounter (Signed)
Called pt and LVM to informed her per her provider Windell Hummingbird that her BMP was elevated and she needed to come into the office today to have the lab repeated STAT.  Informed her that provider belive there was a lab error and just would like to follow-up on her BMP.

## 2018-06-25 LAB — CMP14+EGFR
A/G RATIO: 1.7 (ref 1.2–2.2)
ALK PHOS: 74 IU/L (ref 39–117)
ALT: 77 IU/L — AB (ref 0–32)
AST: 39 IU/L (ref 0–40)
Albumin: 4.5 g/dL (ref 3.5–5.5)
BUN/Creatinine Ratio: 19 (ref 9–23)
BUN: 16 mg/dL (ref 6–24)
CALCIUM: 9.8 mg/dL (ref 8.7–10.2)
CHLORIDE: 104 mmol/L (ref 96–106)
CO2: 15 mmol/L — CL (ref 20–29)
Creatinine, Ser: 0.85 mg/dL (ref 0.57–1.00)
GFR calc Af Amer: 88 mL/min/{1.73_m2} (ref >59)
GFR calc non Af Amer: 76 mL/min/{1.73_m2} (ref >59)
GLOBULIN, TOTAL: 2.7 g/dL (ref 1.5–4.5)
Glucose: 89 mg/dL (ref 65–99)
POTASSIUM: 4.8 mmol/L (ref 3.5–5.2)
SODIUM: 145 mmol/L — AB (ref 134–144)
Total Protein: 7.2 g/dL (ref 6.0–8.5)

## 2018-06-25 LAB — BASIC METABOLIC PANEL
BUN/Creatinine Ratio: 16 (ref 9–23)
BUN: 15 mg/dL (ref 6–24)
CALCIUM: 9.3 mg/dL (ref 8.7–10.2)
CO2: 25 mmol/L (ref 20–29)
CREATININE: 0.94 mg/dL (ref 0.57–1.00)
Chloride: 100 mmol/L (ref 96–106)
GFR, EST AFRICAN AMERICAN: 78 mL/min/{1.73_m2} (ref >59)
GFR, EST NON AFRICAN AMERICAN: 68 mL/min/{1.73_m2} (ref >59)
Glucose: 128 mg/dL — ABNORMAL HIGH (ref 65–99)
Potassium: 3.8 mmol/L (ref 3.5–5.2)
Sodium: 143 mmol/L (ref 134–144)

## 2018-06-25 LAB — HIV ANTIBODY (ROUTINE TESTING W REFLEX): HIV Screen 4th Generation wRfx: NONREACTIVE

## 2018-06-25 LAB — HCV INTERPRETATION

## 2018-06-25 LAB — HCV AB W REFLEX TO QUANT PCR

## 2018-06-25 NOTE — Telephone Encounter (Signed)
Pt was advised of results, provider has placed a repeat lab and pt had been informed. She will come into the office today and when the results come back the provider will give her a call about labs.

## 2018-06-26 ENCOUNTER — Ambulatory Visit: Payer: PRIVATE HEALTH INSURANCE

## 2018-06-28 ENCOUNTER — Encounter: Payer: Self-pay | Admitting: Physician Assistant

## 2018-07-14 ENCOUNTER — Ambulatory Visit (INDEPENDENT_AMBULATORY_CARE_PROVIDER_SITE_OTHER): Payer: PRIVATE HEALTH INSURANCE | Admitting: Neurology

## 2018-07-14 DIAGNOSIS — G4734 Idiopathic sleep related nonobstructive alveolar hypoventilation: Secondary | ICD-10-CM

## 2018-07-14 DIAGNOSIS — R9431 Abnormal electrocardiogram [ECG] [EKG]: Secondary | ICD-10-CM

## 2018-07-14 DIAGNOSIS — F17200 Nicotine dependence, unspecified, uncomplicated: Secondary | ICD-10-CM

## 2018-07-14 DIAGNOSIS — R519 Headache, unspecified: Secondary | ICD-10-CM

## 2018-07-14 DIAGNOSIS — J449 Chronic obstructive pulmonary disease, unspecified: Secondary | ICD-10-CM

## 2018-07-14 DIAGNOSIS — G4720 Circadian rhythm sleep disorder, unspecified type: Secondary | ICD-10-CM

## 2018-07-14 DIAGNOSIS — R51 Headache: Secondary | ICD-10-CM

## 2018-07-14 DIAGNOSIS — G4733 Obstructive sleep apnea (adult) (pediatric): Secondary | ICD-10-CM

## 2018-07-14 DIAGNOSIS — G4761 Periodic limb movement disorder: Secondary | ICD-10-CM

## 2018-07-14 DIAGNOSIS — R0602 Shortness of breath: Secondary | ICD-10-CM

## 2018-07-14 DIAGNOSIS — R400 Somnolence: Secondary | ICD-10-CM

## 2018-07-14 DIAGNOSIS — R419 Unspecified symptoms and signs involving cognitive functions and awareness: Secondary | ICD-10-CM

## 2018-07-16 LAB — COLOGUARD: Cologuard: POSITIVE

## 2018-07-17 ENCOUNTER — Telehealth: Payer: Self-pay

## 2018-07-17 NOTE — Telephone Encounter (Signed)
I called pt again to discuss her sleep study results. No answer, left a message asking her to call me back. 

## 2018-07-17 NOTE — Addendum Note (Signed)
Addended by: Star Age on: 07/17/2018 08:14 AM   Modules accepted: Orders

## 2018-07-17 NOTE — Telephone Encounter (Signed)
I called pt again to discuss. No answer, left a message asking her to call me back. If pt calls back, please skype me.

## 2018-07-17 NOTE — Telephone Encounter (Signed)
I called pt to discuss her sleep study results. No answer, left a message asking her to call me back. 

## 2018-07-17 NOTE — Telephone Encounter (Signed)
Pt returning RN's call.

## 2018-07-17 NOTE — Procedures (Signed)
PATIENT'S NAME:  Jennifer Werner, Jennifer Werner DOB:      1961-01-26      MR#:    [id number]     DATE OF RECORDING: 07/14/2018 REFERRING M.D.:  Windell Hummingbird, PA Study Performed:   CPAP  Titration History: 57 year old woman with a history of COPD, smoking, shortness of breath, lower extremity swelling, and obesity, who returns for a CPAP Titration sleep study. Her baseline PSG on 05/15/18 showed a total AHI of 8.3, REM AHI of 35/hour, and a Supine AHI of 18.7/hour, with an oxygen saturation nadir of 81%. The patient endorsed the Epworth Sleepiness Scale at 21/24 points. The patient's weight 215 pounds with a height of 65 (inches), resulting in a BMI of 36. kg/m2. The patient's neck circumference measured 17.5 inches.  CURRENT MEDICATIONS: Combivent, Lasix, K-Dur, Chantix, Wixela Inhub.  PROCEDURE:  This is a multichannel digital polysomnogram utilizing the SomnoStar 11.2 system.  Electrodes and sensors were applied and monitored per AASM Specifications.   EEG, EOG, Chin and Limb EMG, were sampled at 200 Hz.  ECG, Snore and Nasal Pressure, Thermal Airflow, Respiratory Effort, CPAP Flow and Pressure, Oximetry was sampled at 50 Hz. Digital video and audio were recorded.      The patient was fitted with P10 nasal pillows, medium. CPAP was initiated at 5 cmH20 with heated humidity per AASM standards and pressure was advanced to 14 cmH20 because of hypopneas, apneas and desaturations.  At a PAP pressure of 14 cmH20, there was a reduction of the AHI to 0/hour with non-supine NREM sleep achieved and O2 nadir of 93%.  Lights Out was at 22:05 and Lights On at 05:35. Total recording time (TRT) was 451 minutes, with a total sleep time (TST) of 403.5 minutes. The patient's sleep latency was 13 minutes. REM latency was 116 minutes.  The sleep efficiency was 89.5 %.    SLEEP ARCHITECTURE: WASO (Wake after sleep onset) was 39.5 minutes with mild sleep fragmentation noted. There were 11.5 minutes in Stage N1, 216.5 minutes Stage N2, 86  minutes Stage N3 and 89.5 minutes in Stage REM.  The percentage of Stage N1 was 2.9%, Stage N2 was 53.7%, which is normal, Stage N3 was 21.3%, which is normal, and Stage R (REM sleep) was 22.2%, which is normal. The arousals were noted as: 43 were spontaneous, 6 were associated with PLMs, 9 were associated with respiratory events.  RESPIRATORY ANALYSIS:  There was a total of 52 respiratory events: 0 obstructive apneas, 2 central apneas and 0 mixed apneas with a total of 2 apneas and an apnea index (AI) of .3 /hour. There were 50 hypopneas with a hypopnea index of 7.4/hour. The patient also had 0 respiratory event related arousals (RERAs).      The total APNEA/HYPOPNEA INDEX  (AHI) was 7.7/hour and the total RESPIRATORY DISTURBANCE INDEX was 7.7 0./hour  30 events occurred in REM sleep and 22 events in NREM. The REM AHI was 20.1 /hour versus a non-REM AHI of 4.2 0./hour.  The patient spent 102 minutes of total sleep time in the supine position and 302 minutes in non-supine. The supine AHI was 4.7/hour, versus a non-supine AHI of 8.8/hour.  OXYGEN SATURATION & C02:  The baseline 02 saturation was 92%, with the lowest being 83%. Time spent below 89% saturation equaled 27 minutes.  PERIODIC LIMB MOVEMENTS:  The patient had a total of 14 Periodic Limb Movements. The Periodic Limb Movement (PLM) index was 2.1 and the PLM Arousal index was 0.9/hour.  Audio and video  analysis did not show any abnormal or unusual movements, behaviors, phonations or vocalizations. The patient took no bathroom breaks. The EKG was in keeping with normal sinus rhythm (NSR) with occasional PVCs noted.  IMPRESSION:   1. Obstructive Sleep Apnea (OSA) 2. Non-specific abnormal EKG   RECOMMENDATIONS:   1. This study demonstrates resolution of the patient's obstructive sleep apnea with CPAP therapy. I will, therefore, start the patient on home CPAP treatment at a pressure of 14 cm via medium nasal pillows with heated humidity. The  patient should be reminded to be fully compliant with PAP therapy to improve sleep related symptoms and decrease long term cardiovascular risks. The patient should be reminded, that it may take up to 3 months to get fully used to using PAP with all planned sleep. The earlier full compliance is achieved, the better long term compliance tends to be. Please note that untreated obstructive sleep apnea may carry additional perioperative morbidity. Patients with significant obstructive sleep apnea should receive perioperative PAP therapy and the surgeons and particularly the anesthesiologist should be informed of the diagnosis and the severity of the sleep disordered breathing. 2. The study showed occasional PVCs on single lead EKG; clinical correlation is recommended and consultation with cardiology may be feasible.  3. The patient should be cautioned not to drive, work at heights, or operate dangerous or heavy equipment when tired or sleepy. Review and reiteration of good sleep hygiene measures should be pursued with any patient. 4. The patient will be seen in follow-up in the sleep clinic at [facility] for discussion of the test results, symptom and treatment compliance review, further management strategies, etc. The referring provider will be notified of the test results.   I certify that I have reviewed the entire raw data recording prior to the issuance of this report in accordance with the Standards of Accreditation of the American Academy of Sleep Medicine (AASM)     Star Age, MD, PhD Diplomat, American Board of Psychiatry and Neurology (Neurology and Sleep Medicine)

## 2018-07-17 NOTE — Telephone Encounter (Signed)
I called pt. I advised pt that Dr. Rexene Alberts reviewed their sleep study results and found that pt did well with a cpap during the latest sleep study. Dr. Rexene Alberts recommends that pt start a cpap at home. I reviewed PAP compliance expectations with the pt. Pt is agreeable to starting a CPAP. I advised pt that an order will be sent to a DME, [facility], and [facility] will call the pt within about one week after they file with the pt's insurance. [facility] will show the pt how to use the machine, fit for masks, and troubleshoot the CPAP if needed. A follow up appt was made for insurance purposes with Dr. Rexene Alberts on 10/07/18 at 1:00pm. Pt verbalized understanding to arrive 15 minutes early and bring their CPAP. A letter with all of this information in it will be mailed to the pt as a reminder. I verified with the pt that the address we have on file is correct. Pt verbalized understanding of results. Pt had no questions at this time but was encouraged to call back if questions arise.

## 2018-07-17 NOTE — Telephone Encounter (Signed)
-----   Message from Star Age, MD sent at 07/17/2018  8:14 AM EDT ----- Patient referred by Windell Hummingbird, seen by me on 04/25/18, diagnostic PSG on 05/15/18. Patient had a CPAP titration study on 07/14/18. Please call and inform patient that I have entered an order for treatment with positive airway pressure (PAP) treatment for obstructive sleep apnea (OSA). She did well during the latest sleep study with CPAP. We will, therefore, arrange for a machine for home use through a DME (durable medical equipment) company of Her choice; and I will see the patient back in follow-up in about 10 weeks. Please also explain to the patient that I will be looking out for compliance data, which can be downloaded from the machine (stored on an SD card, that is inserted in the machine) or via remote access through a modem, that is built into the machine. At the time of the followup appointment we will discuss sleep study results and how it is going with PAP treatment at home. Please advise patient to bring Her machine at the time of the first FU visit, even though this is cumbersome. Bringing the machine for every visit after that will likely not be needed, but often helps for the first visit to troubleshoot if needed. Please re-enforce the importance of compliance with treatment and the need for [place] to monitor compliance data - often an insurance requirement and actually good feedback for the patient as far as how they are doing.  Also remind patient, that any interim PAP machine or mask issues should be first addressed with the DME company, as they can often help better with technical and mask fit issues. Please ask if patient has a preference regarding DME company.  Please also make sure, the patient has a follow-up appointment with me in about 10 weeks from the setup date, thanks. May see one of our nurse practitioners if needed for proper timing of the FU appointment.  Please fax or rout report to the referring provider.  Thanks,   Star Age, MD, PhD [facility])

## 2018-07-17 NOTE — Progress Notes (Signed)
Patient referred by Windell Hummingbird, seen by me on 04/25/18, diagnostic PSG on 05/15/18. Patient had a CPAP titration study on 07/14/18. Please call and inform patient that I have entered an order for treatment with positive airway pressure (PAP) treatment for obstructive sleep apnea (OSA). She did well during the latest sleep study with CPAP. We will, therefore, arrange for a machine for home use through a DME (durable medical equipment) company of Her choice; and I will see the patient back in follow-up in about 10 weeks. Please also explain to the patient that I will be looking out for compliance data, which can be downloaded from the machine (stored on an SD card, that is inserted in the machine) or via remote access through a modem, that is built into the machine. At the time of the followup appointment we will discuss sleep study results and how it is going with PAP treatment at home. Please advise patient to bring Her machine at the time of the first FU visit, even though this is cumbersome. Bringing the machine for every visit after that will likely not be needed, but often helps for the first visit to troubleshoot if needed. Please re-enforce the importance of compliance with treatment and the need for [place] to monitor compliance data - often an insurance requirement and actually good feedback for the patient as far as how they are doing.  Also remind patient, that any interim PAP machine or mask issues should be first addressed with the DME company, as they can often help better with technical and mask fit issues. Please ask if patient has a preference regarding DME company.  Please also make sure, the patient has a follow-up appointment with me in about 10 weeks from the setup date, thanks. May see one of our nurse practitioners if needed for proper timing of the FU appointment.  Please fax or rout report to the referring provider. Thanks,   Star Age, MD, PhD [facility])

## 2018-07-17 NOTE — Telephone Encounter (Signed)
Pt returning RNs call, requesting a call back after 11:30

## 2018-07-30 ENCOUNTER — Other Ambulatory Visit: Payer: Self-pay | Admitting: Physician Assistant

## 2018-07-30 NOTE — Telephone Encounter (Signed)
chantix refill Last Refill:05/02/18 # 180 Last OV: 04/19/18 PCP: [place]: [facility] [address] [place], [place]

## 2018-07-30 NOTE — Telephone Encounter (Signed)
Patient is requesting a refill of the following medications: Requested Prescriptions   Pending Prescriptions Disp Refills  . CHANTIX 1 MG tablet [Pharmacy Med Name: CHANTIX 1 MG TABLET] 180 tablet 0    Sig: TAKE 1 TABLET BY MOUTH TWICE A DAY    Date of patient request: 07/30/2018 Last office visit: 06/21/2018 Date of last refill:  Last refill amount: Follow up time period per chart:

## 2018-08-02 ENCOUNTER — Telehealth: Payer: Self-pay | Admitting: Physician Assistant

## 2018-08-02 ENCOUNTER — Encounter: Payer: Self-pay | Admitting: Gastroenterology

## 2018-08-02 DIAGNOSIS — R195 Other fecal abnormalities: Secondary | ICD-10-CM

## 2018-08-02 DIAGNOSIS — Z1211 Encounter for screening for malignant neoplasm of colon: Secondary | ICD-10-CM

## 2018-08-02 NOTE — Telephone Encounter (Signed)
Please advise   Copied from [employer] [id number]. Topic: Referral - Request >> Aug 02, 2018 10:07 AM Bea Graff, NT wrote: Reason for CRM: Pt would like a referral to a GI doctor in [facility] for a colonoscopy. Please let pt know where the order is sent.

## 2018-08-02 NOTE — Telephone Encounter (Signed)
Done

## 2018-08-04 ENCOUNTER — Telehealth: Payer: Self-pay

## 2018-08-04 NOTE — Telephone Encounter (Signed)
Copied from [id number]. Topic: Referral - Request >> Aug 02, 2018 10:07 AM Bea Graff, NT wrote: Reason for CRM: Pt would like a referral to a GI doctor in [facility] for a colonoscopy. Please let pt know where the order is sent.  Message sent to Windell Hummingbird

## 2018-08-09 NOTE — Telephone Encounter (Signed)
Already done

## 2018-08-12 ENCOUNTER — Other Ambulatory Visit: Payer: Self-pay | Admitting: Physician Assistant

## 2018-08-12 DIAGNOSIS — R0602 Shortness of breath: Secondary | ICD-10-CM

## 2018-08-12 NOTE — Telephone Encounter (Signed)
combivent respimat 20-100 mcgrefill Last Refill:05/21/18 # 1 Last OV: 06/21/18 PCP: [phone]

## 2018-08-22 ENCOUNTER — Ambulatory Visit (INDEPENDENT_AMBULATORY_CARE_PROVIDER_SITE_OTHER): Payer: PRIVATE HEALTH INSURANCE | Admitting: Internal Medicine

## 2018-08-22 ENCOUNTER — Encounter: Payer: Self-pay | Admitting: Internal Medicine

## 2018-08-22 VITALS — BP 122/72 | HR 60 | Ht 64.5 in | Wt 223.0 lb

## 2018-08-22 DIAGNOSIS — J449 Chronic obstructive pulmonary disease, unspecified: Secondary | ICD-10-CM

## 2018-08-22 DIAGNOSIS — K219 Gastro-esophageal reflux disease without esophagitis: Secondary | ICD-10-CM

## 2018-08-22 LAB — PULMONARY FUNCTION TEST
DL/VA % PRED: 90 %
DL/VA: 4.41 ml/min/mmHg/L
DLCO UNC % PRED: 72 %
DLCO unc: 18.08 ml/min/mmHg
FEF 25-75 POST: 1.05 L/s
FEF 25-75 PRE: 0.63 L/s
FEF2575-%Change-Post: 66 %
FEF2575-%PRED-POST: 42 %
FEF2575-%Pred-Pre: 25 %
FEV1-%CHANGE-POST: 20 %
FEV1-%Pred-Post: 56 %
FEV1-%Pred-Pre: 46 %
FEV1-POST: 1.5 L
FEV1-Pre: 1.25 L
FEV1FVC-%Change-Post: 8 %
FEV1FVC-%Pred-Pre: 76 %
FEV6-%CHANGE-POST: 10 %
FEV6-%Pred-Post: 69 %
FEV6-%Pred-Pre: 63 %
FEV6-PRE: 2.09 L
FEV6-Post: 2.31 L
FEV6FVC-%Change-Post: 0 %
FEV6FVC-%PRED-PRE: 102 %
FEV6FVC-%Pred-Post: 102 %
FVC-%Change-Post: 10 %
FVC-%PRED-POST: 67 %
FVC-%Pred-Pre: 61 %
FVC-Post: 2.31 L
FVC-Pre: 2.09 L
POST FEV1/FVC RATIO: 65 %
Post FEV6/FVC ratio: 100 %
Pre FEV1/FVC ratio: 60 %
Pre FEV6/FVC Ratio: 100 %
RV % PRED: 150 %
RV: 2.96 L
TLC % pred: 106 %
TLC: 5.44 L

## 2018-08-22 MED ORDER — TIOTROPIUM BROMIDE MONOHYDRATE 2.5 MCG/ACT IN AERS: 2.0000 | INHALATION_SPRAY | Freq: Every day | RESPIRATORY_TRACT | 11 refills | Status: DC

## 2018-08-22 MED ORDER — ALBUTEROL SULFATE HFA 108 (90 BASE) MCG/ACT IN AERS: INHALATION_SPRAY | RESPIRATORY_TRACT | 1 refills | Status: DC

## 2018-08-22 MED ORDER — TIOTROPIUM BROMIDE MONOHYDRATE 2.5 MCG/ACT IN AERS: 2.0000 | INHALATION_SPRAY | Freq: Every day | RESPIRATORY_TRACT | 0 refills | Status: DC

## 2018-08-22 NOTE — Progress Notes (Signed)
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 worse in winter with baseline 120 lf then doe x 2017 worse since winter of 2019 rx with advair 500 dose /combivent and prednisone > 50% back to baseline so referred to pulmonary clinic 05/21/2018 by Windell Hummingbird.   Sleep study 05/15/18 c/w AHI 8 but desats > plan cpap titration byDr Rexene Alberts    05/21/2018 1st  Pulmonary office visit/ Clista Rainford   Chief Complaint  Patient presents with  . Pulmonary Consult    Referred by Windell Hummingbird, PA for eval of hypoxia.  She states she has been having SOB since Winter 2017. She states she presented to her PCP with SOB in April 2019 and had o2 sat of 88%ra. She states she gets SOB walking for approx 5-10 min.  She has occ cough, prod in the am after inhaler with tan sputum.    sleeps horizontal rotated on L side wakes up each am feeling tired / hits snooze button twice /some am cough/ congestion and then uses combivent x one and w/in 15 min better then later on in the am finally gets around to taking the advair 500. Breathing worse in hot weather. 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 Plan A = Automatic = Symbicort 160 Take 2 puffs first thing in am and then another 2 puffs about 12 hours later.  Work on inhaler technique:  relax and gently blow all the way out then take a nice smooth deep breath back in, triggering the inhaler at same time you start breathing in.  Hold for up to 5 seconds if you can. Blow out thru nose. Rinse and gargle with water when done Plan B = Backup Only use your combivent as a rescue  medication    08/22/2018  f/u ov/Amara Manalang re: copd /ab component  Chief Complaint  Patient presents with  . Follow-up    PFT results, having nightly heartburn annd reflux, SHOB with ambulation  Dyspnea:  MMRC2 = can't walk a nl pace on a flat grade s sob but does fine slow and flat / also some back pain  Cough: gone Sleeping: L side down / 1 pillow / flat with cpap SABA use: avg twice weekly  02: no    No obvious day to day or daytime variability or assoc excess/ purulent sputum or mucus plugs or hemoptysis or cp or chest tightness, subjective wheeze or overt sinus problems but does have overt  hb symptoms taking ppi hs   Sleeping as above  without nocturnal  or early am exacerbation  of respiratory  c/o's or need for noct saba. Also denies any obvious fluctuation of symptoms with weather or environmental changes or other aggravating or alleviating factors except as outlined above   No unusual exposure hx or h/o childhood pna/ asthma or knowledge of premature birth.  Current Allergies, Complete Past Medical History, Past Surgical History, Family History, and Social History were reviewed in [employer] record.  ROS  The following are not active complaints unless bolded Hoarseness, sore throat, dysphagia, dental problems, itching, sneezing,  nasal congestion or discharge of excess mucus or purulent secretions, ear ache,   fever, chills, sweats, unintended wt loss or wt gain, classically pleuritic or exertional cp,  orthopnea pnd or arm/hand swelling  or leg swelling, presyncope, palpitations, abdominal pain, anorexia, nausea, vomiting, diarrhea  or change in bowel habits or change in bladder habits, change in stools or change in urine, dysuria, hematuria,  rash, arthralgias, visual complaints, headache, numbness, weakness or ataxia or problems with walking or coordination,  change in mood or  memory.        Current Meds  Medication Sig  . budesonide-formoterol  (SYMBICORT) 160-4.5 MCG/ACT inhaler Take 2 puffs first thing in am and then another 2 puffs about 12 hours later.  . furosemide (LASIX) 20 MG tablet TAKE 1 TABLET BY MOUTH EVERY DAY  . ibuprofen (ADVIL,MOTRIN) 200 MG tablet Take 400 mg by mouth as needed.  Marland Kitchen omeprazole (PRILOSEC) 20 MG capsule Take 20 mg by mouth daily.  . potassium chloride (K-DUR) 10 MEQ tablet Take 1 tablet (10 mEq total) by mouth daily.  .     Golden Hurter RESPIMAT 20-100 MCG/ACT AERS respimat INHALE 1 PUFF INTO THE LUNGS EVERY 6 (SIX) HOURS.                   Objective:   Physical Exam  amb wf nad    08/22/2018       223   05/21/18 216 lb (98 kg)  04/30/18 214 lb 1.9 oz (97.1 kg)  04/25/18 215 lb (97.5 kg)    Vital signs reviewed - Note on arrival 02 sats  95% on RA     HEENT: nl dentition, turbinates bilaterally, and oropharynx. Nl external ear canals without cough reflex   NECK :  without JVD/Nodes/TM/ nl carotid upstrokes bilaterally   LUNGS: no acc muscle use,  Nl contour chest which is clear to A and P bilaterally without cough on insp or exp maneuvers   CV:  RRR  no s3 or murmur or increase in P2, and no edema   ABD:  soft and nontender with nl inspiratory excursion in the supine position. No bruits or organomegaly appreciated, bowel sounds nl  MS:  Nl gait/ ext warm without deformities, calf tenderness, cyanosis or clubbing No obvious joint restrictions   SKIN: warm and dry without lesions    NEURO:  alert, approp, nl sensorium with  no motor or cerebellar deficits apparent.             Assessment:

## 2018-08-22 NOTE — Progress Notes (Signed)
PFT done today. 

## 2018-08-22 NOTE — Patient Instructions (Addendum)
Stop comibivent and add spiriva 2 pffs each am after your am symbicort   Work on inhaler technique:  relax and gently blow all the way out then take a nice smooth deep breath back in, triggering the inhaler at same time you start breathing in.  Hold for up to 5 seconds if you can. Blow out thru nose. Rinse and gargle with water when done   Only use your albuterol as a rescue medication to be used if you can't catch your breath by resting or doing a relaxed purse lip breathing pattern.  - The less you use it, the better it will work when you need it. - Ok to use up to 2 puffs  every 4 hours if you must but call for immediate appointment if use goes up over your usual need - Don't leave home without it !!  (think of it like the spare tire for your car)    Please schedule a follow up visit in 3 months but call sooner if needed

## 2018-08-23 ENCOUNTER — Other Ambulatory Visit: Payer: Self-pay | Admitting: Internal Medicine

## 2018-08-23 MED ORDER — TIOTROPIUM BROMIDE MONOHYDRATE 2.5 MCG/ACT IN AERS: 2.0000 | INHALATION_SPRAY | Freq: Every day | RESPIRATORY_TRACT | 3 refills | Status: DC

## 2018-08-24 ENCOUNTER — Encounter: Payer: Self-pay | Admitting: Internal Medicine

## 2018-08-24 DIAGNOSIS — K219 Gastro-esophageal reflux disease without esophagitis: Secondary | ICD-10-CM | POA: Insufficient documentation

## 2018-08-24 NOTE — Assessment & Plan Note (Addendum)
05/21/2018  Walked RA x 3 laps @ 185 ft each stopped due to  End of study, fast pace, no desat but sob at end of study   Spirometry 05/21/2018  FEV1 1.22 (45%)  Ratio 58 p am advair 500     PFT's  08/22/2018  FEV1 1.50 (56 % ) ratio 65  p 20 % improvement from saba p symb 160 x2  prior to study with DLCO  72 % corrects to 90 % for alv volume   - 08/22/2018  After extensive coaching inhaler device,  effectiveness =    90% with SMI    Group D in terms of symptom/risk and laba/lama/ICS  therefore appropriate rx at this point so will add lama to see if notes improved ex tol (her goal) and emphasized avoid any smoking at all costs   Also needs to change from combivent to saba prn to prevent excess anticholinergic side effects from using sama with saba.    I had an extended discussion with the patient reviewing all relevant studies completed to date and  lasting 15 to 20 minutes of a 25 minute visit    See device teaching which extended face to face time for this visit.  Each maintenance medication was reviewed in detail including emphasizing most importantly the difference between maintenance and prns and under what circumstances the prns are to be triggered using an action plan format that is not reflected in the computer generated alphabetically organized AVS which I have not found useful in most complex patients, especially with respiratory illnesses  Please see AVS for specific instructions unique to this visit that I personally wrote and verbalized to the the pt in detail and then reviewed with pt  by my nurse highlighting any  changes in therapy recommended at today's visit to their plan of care.

## 2018-08-24 NOTE — Assessment & Plan Note (Signed)
rec change ppi to taking 30 min a/c plus diet

## 2018-08-26 ENCOUNTER — Ambulatory Visit: Payer: PRIVATE HEALTH INSURANCE | Admitting: Cardiology

## 2018-08-26 ENCOUNTER — Encounter: Payer: Self-pay | Admitting: Cardiology

## 2018-08-26 VITALS — BP 116/64 | HR 67 | Ht 64.75 in | Wt 226.0 lb

## 2018-08-26 DIAGNOSIS — J449 Chronic obstructive pulmonary disease, unspecified: Secondary | ICD-10-CM

## 2018-08-26 DIAGNOSIS — K219 Gastro-esophageal reflux disease without esophagitis: Secondary | ICD-10-CM

## 2018-08-26 DIAGNOSIS — R0609 Other forms of dyspnea: Secondary | ICD-10-CM | POA: Diagnosis not present

## 2018-08-26 NOTE — Patient Instructions (Signed)
Medication Instructions:  Your physician recommends that you continue on your current medications as directed. Please refer to the Current Medication list given to you today.   Labwork: Your physician recommends that you return for lab work today: BMP  Testing/Procedures: None.  Follow-Up: Your physician wants you to follow-up in: 6 months. You will receive a reminder letter in the mail two months in advance. If you don't receive a letter, please call our office to schedule the follow-up appointment.  Any Other Special Instructions Will Be Listed Below (If Applicable).     If you need a refill on your cardiac medications before your next appointment, please call your pharmacy.

## 2018-08-26 NOTE — Progress Notes (Signed)
Cardiology Office Note:    Date:  08/26/2018   ID:  Jennifer Werner, DOB 06/05/1961, MRN [id number]  PCP:  [name], PA-C  Cardiologist:  Jenne Campus, MD    Referring MD: [name], PA-C   Chief Complaint  Patient presents with  . Follow-up  Doing well  History of Present Illness:    Jennifer Werner is a 57 y.o. female with dyspnea on exertion which appears to be related to COPD.  She is under excellent care by pulmonary team and she is doing better.  She quit smoking she gained about 15 pounds since then which is very happy about.  But overall seems to be doing well.  Past Medical History:  Diagnosis Date  . Eczema   . Smoker     Past Surgical History:  Procedure Laterality Date  . ABLATION     Uterus    Current Medications: Current Meds  Medication Sig  . albuterol (PROAIR HFA) 108 (90 Base) MCG/ACT inhaler 2 puffs every 4 hours as needed only  if your can't catch your breath  . budesonide-formoterol (SYMBICORT) 160-4.5 MCG/ACT inhaler Take 2 puffs first thing in am and then another 2 puffs about 12 hours later.  . furosemide (LASIX) 40 MG tablet Take 40 mg by mouth daily.  Marland Kitchen ibuprofen (ADVIL,MOTRIN) 200 MG tablet Take 400 mg by mouth as needed.  Marland Kitchen omeprazole (PRILOSEC) 20 MG capsule Take 20 mg by mouth daily.  . potassium chloride (K-DUR) 10 MEQ tablet Take 1 tablet (10 mEq total) by mouth daily.  . Tiotropium Bromide Monohydrate (SPIRIVA RESPIMAT) 2.5 MCG/ACT AERS Inhale 2 puffs into the lungs daily.     Allergies:   Latex and Codeine   Social History   Socioeconomic History  . Marital status: Married    Spouse name: Not on file  . Number of children: Not on file  . Years of education: Not on file  . Highest education level: Not on file  Occupational History  . Not on file  Social Needs  . Financial resource strain: Not on file  . Food insecurity:    Worry: Not on file    Inability: Not on file  . Transportation needs:    Medical: Not on  file    Non-medical: Not on file  Tobacco Use  . Smoking status: Former Smoker    Packs/day: 2.00    Years: 47.00    Pack years: 94.00    Types: Cigarettes    Last attempt to quit: 05/31/2018    Years since quitting: 0.2  . Smokeless tobacco: Never Used  Substance and Sexual Activity  . Alcohol use: Yes    Comment: 5-7 drinks a week  . Drug use: Never  . Sexual activity: Not on file  Lifestyle  . Physical activity:    Days per week: Not on file    Minutes per session: Not on file  . Stress: Not on file  Relationships  . Social connections:    Talks on phone: Not on file    Gets together: Not on file    Attends religious service: Not on file    Active member of club or organization: Not on file    Attends meetings of clubs or organizations: Not on file    Relationship status: Not on file  Other Topics Concern  . Not on file  Social History Narrative   RN - desk work mainly   Lives with husband  Family History: The patient's family history includes Alcohol abuse in her brother and father; Asthma in her brother; Cardiomyopathy in her brother, father, paternal aunt, and paternal uncle; Congestive Heart Failure in her mother; Heart block in her mother; Lymphoma in her mother; Rheum arthritis in her paternal grandmother. ROS:   Please see the history of present illness.    All 14 point review of systems negative except as described per history of present illness  EKGs/Labs/Other Studies Reviewed:      Recent Labs: 04/02/2018: Hemoglobin 16.6; Platelets 260; TSH 0.886 04/09/2018: NT-Pro BNP 30 06/21/2018: ALT 77 06/25/2018: BUN 15; Creatinine, Ser 0.94; Potassium 3.8; Sodium 143  Recent Lipid Panel    Component Value Date/Time   CHOL 166 04/02/2018 1222   TRIG 90 04/02/2018 1222   HDL 46 04/02/2018 1222   CHOLHDL 3.6 04/02/2018 1222   LDLCALC 102 (H) 04/02/2018 1222    Physical Exam:    VS:  BP 116/64   Pulse 67   Ht 5' 4.75" (1.645 m)   Wt 226 lb (102.5 kg)    SpO2 95%   BMI 37.90 kg/m     Wt Readings from Last 3 Encounters:  08/26/18 226 lb (102.5 kg)  08/22/18 223 lb (101.2 kg)  06/21/18 216 lb (98 kg)     GEN:  Well nourished, well developed in no acute distress HEENT: Normal NECK: No JVD; No carotid bruits LYMPHATICS: No lymphadenopathy CARDIAC: RRR, no murmurs, no rubs, no gallops RESPIRATORY:  Clear to auscultation without rales, wheezing or rhonchi  ABDOMEN: Soft, non-tender, non-distended MUSCULOSKELETAL:  No edema; No deformity  SKIN: Warm and dry LOWER EXTREMITIES: no swelling NEUROLOGIC:  Alert and oriented x 3 PSYCHIATRIC:  Normal affect   ASSESSMENT:    1. Dyspnea on exertion   2. COPD GOLD  II/ still smoking    3. Gastroesophageal reflux disease without esophagitis    PLAN:    In order of problems listed above:  1. Dyspnea on exertion most likely COPD related.  She does have diastolic dysfunction on echocardiogram she is on small dose of diuretic I will check her Chem-7 today as well as proBNP 2. COPD followed by pulmonary. 3. Gastroesophageal reflux disease denies having any problems now.   Medication Adjustments/Labs and Tests Ordered: Current medicines are reviewed at length with the patient today.  Concerns regarding medicines are outlined above.  Orders Placed This Encounter  Procedures  . Basic metabolic panel   Medication changes: No orders of the defined types were placed in this encounter.   Signed, Park Liter, MD, [facility] 08/26/2018 12:16 PM    [place]

## 2018-08-27 LAB — BASIC METABOLIC PANEL
BUN/Creatinine Ratio: 20 (ref 9–23)
BUN: 15 mg/dL (ref 6–24)
CO2: 24 mmol/L (ref 20–29)
Calcium: 9.5 mg/dL (ref 8.7–10.2)
Chloride: 98 mmol/L (ref 96–106)
Creatinine, Ser: 0.75 mg/dL (ref 0.57–1.00)
GFR calc Af Amer: 102 mL/min/{1.73_m2} (ref >59)
GFR calc non Af Amer: 88 mL/min/{1.73_m2} (ref >59)
Glucose: 113 mg/dL — ABNORMAL HIGH (ref 65–99)
Potassium: 4.2 mmol/L (ref 3.5–5.2)
Sodium: 140 mmol/L (ref 134–144)

## 2018-08-28 ENCOUNTER — Encounter: Payer: Self-pay | Admitting: Vascular Surgery

## 2018-08-28 ENCOUNTER — Ambulatory Visit: Payer: PRIVATE HEALTH INSURANCE | Admitting: Vascular Surgery

## 2018-08-28 ENCOUNTER — Other Ambulatory Visit: Payer: Self-pay

## 2018-08-28 VITALS — BP 130/75 | HR 65 | Temp 97.8°F | Resp 18 | Ht 64.75 in | Wt 227.2 lb

## 2018-08-28 DIAGNOSIS — I83813 Varicose veins of bilateral lower extremities with pain: Secondary | ICD-10-CM

## 2018-08-28 NOTE — Progress Notes (Signed)
Patient name: Jennifer Werner MRN: [id number] DOB: 07/02/1961 Sex: female  REASON FOR CONSULT: Symptomatic varicose veins with pain  HPI: Jennifer Werner is a 57 y.o. female, who was last seen by my partner Dr. Bridgett Larsson about 3 months ago.  She has had chronic intermittent leg swelling of both legs.  She also has some aching burning sensation and itching of her skin when standing on her legs for long periods of time.  This usually improves overnight.  Her swelling also improved somewhat with Lasix use.  She has been wearing compression stockings for the last 3 months and has been compliant with this.  Her symptoms have not really improved.  He denies prior history of DVT.  She has no family history of varicose veins.  She denies previous ulceration.  Past Medical History:  Diagnosis Date  . Eczema   . Smoker    Past Surgical History:  Procedure Laterality Date  . ABLATION     Uterus    Family History  Problem Relation Age of Onset  . Heart block Mother   . Lymphoma Mother   . Congestive Heart Failure Mother   . Cardiomyopathy Father   . Alcohol abuse Father   . Cardiomyopathy Brother        Had Heart Transplant  . Alcohol abuse Brother   . Asthma Brother   . Cardiomyopathy Paternal Aunt   . Cardiomyopathy Paternal Uncle   . Rheum arthritis Paternal Grandmother     SOCIAL HISTORY: Social History   Socioeconomic History  . Marital status: Married    Spouse name: Not on file  . Number of children: Not on file  . Years of education: Not on file  . Highest education level: Not on file  Occupational History  . Not on file  Social Needs  . Financial resource strain: Not on file  . Food insecurity:    Worry: Not on file    Inability: Not on file  . Transportation needs:    Medical: Not on file    Non-medical: Not on file  Tobacco Use  . Smoking status: Former Smoker    Packs/day: 2.00    Years: 47.00    Pack years: 94.00    Types: Cigarettes    Last attempt to quit:  05/31/2018    Years since quitting: 0.2  . Smokeless tobacco: Never Used  Substance and Sexual Activity  . Alcohol use: Yes    Comment: 5-7 drinks a week  . Drug use: Never  . Sexual activity: Not on file  Lifestyle  . Physical activity:    Days per week: Not on file    Minutes per session: Not on file  . Stress: Not on file  Relationships  . Social connections:    Talks on phone: Not on file    Gets together: Not on file    Attends religious service: Not on file    Active member of club or organization: Not on file    Attends meetings of clubs or organizations: Not on file    Relationship status: Not on file  . Intimate partner violence:    Fear of current or ex partner: Not on file    Emotionally abused: Not on file    Physically abused: Not on file    Forced sexual activity: Not on file  Other Topics Concern  . Not on file  Social History Narrative   RN - desk work mainly   Lives  with husband    Allergies  Allergen Reactions  . Latex Rash  . Codeine     Current Outpatient Medications  Medication Sig Dispense Refill  . albuterol (PROAIR HFA) 108 (90 Base) MCG/ACT inhaler 2 puffs every 4 hours as needed only  if your can't catch your breath 1 Inhaler 1  . budesonide-formoterol (SYMBICORT) 160-4.5 MCG/ACT inhaler Take 2 puffs first thing in am and then another 2 puffs about 12 hours later. 1 Inhaler 11  . furosemide (LASIX) 40 MG tablet Take 40 mg by mouth daily.    Marland Kitchen ibuprofen (ADVIL,MOTRIN) 200 MG tablet Take 400 mg by mouth as needed.    Marland Kitchen omeprazole (PRILOSEC) 20 MG capsule Take 20 mg by mouth daily.    . potassium chloride (K-DUR) 10 MEQ tablet Take 1 tablet (10 mEq total) by mouth daily. 90 tablet 3  . Tiotropium Bromide Monohydrate (SPIRIVA RESPIMAT) 2.5 MCG/ACT AERS Inhale 2 puffs into the lungs daily. 3 Inhaler 3   No current facility-administered medications for this visit.     ROS:   General:  No weight loss, Fever, chills  HEENT: No recent headaches,  no nasal bleeding, no visual changes, no sore throat  Neurologic: No dizziness, blackouts, seizures. No recent symptoms of stroke or mini- stroke. No recent episodes of slurred speech, or temporary blindness.  Cardiac: No recent episodes of chest pain/pressure, no shortness of breath at rest.  No shortness of breath with exertion.  Denies history of atrial fibrillation or irregular heartbeat  Vascular: No history of rest pain in feet.  No history of claudication.  No history of non-healing ulcer, No history of DVT   Pulmonary: No home oxygen, no productive cough, no hemoptysis,  No asthma or wheezing  Musculoskeletal:  [ ]  Arthritis, [ ]  Low back pain,  [ ]  Joint pain  Hematologic:No history of hypercoagulable state.  No history of easy bleeding.  No history of anemia  Gastrointestinal: No hematochezia or melena,  No gastroesophageal reflux, no trouble swallowing  Urinary: [ ]  chronic Kidney disease, [ ]  on HD - [ ]  MWF or [ ]  TTHS, [ ]  Burning with urination, [ ]  Frequent urination, [ ]  Difficulty urinating;   Skin: No rashes  Psychological: No history of anxiety,  No history of depression   Physical Examination  Vitals:   08/28/18 1551  BP: 130/75  Pulse: 65  Resp: 18  Temp: 97.8 F (36.6 C)  TempSrc: Oral  SpO2: 92%  Weight: 227 lb 3.2 oz (103.1 kg)  Height: 5' 4.75" (1.645 m)    Body mass index is 38.1 kg/m.  General:  Alert and oriented, no acute distress HEENT: Normal Skin: No rash Extremity Pulses:  2+ radial, brachial, femoral, dorsalis pedis, posterior tibial pulses bilaterally Musculoskeletal: No deformity trace pretibial edema bilaterally  Neurologic: Upper and lower extremity motor 5/5 and symmetric  DATA:  I reviewed the patient's previous reflux duplex exam of her lower extremities dated May 29, 2018.  This showed bilateral greater saphenous reflux with a vein diameter of 5 to 7 mm bilaterally  I reexamined her lower extremity superficial venous  system with the SonoSite at the bedside today.  I confirmed that the vein diameter is 4 to 7 mm bilaterally.  The vein dilates up significantly in the mid thigh bilaterally.  ASSESSMENT: Symptomatic varicose veins with superficial venous reflux not really improved with compression therapy.  She is CEAP3  PLAN: I discussed with the patient today the pathophysiology of venous disease.  I also discussed with her the superficial and deep venous systems and discussed with her that I believe she would benefit from laser ablation of the greater saphenous bilaterally for improvement of her leg swelling and aching symptoms.  I also discussed with her that she currently has healthy arteries with palpable pulses and is not at risk currently for limb loss.  The patient has decided at this point she wishes to proceed with laser ablation.  We will work on getting this approved through her insurance.  She will follow-up with me in the near future after insurance approval for staged laser ablation of the right and left legs.  She will continue to wear compression stockings.   Ruta Hinds, MD [facility] Office: [phone] Pager: [phone]

## 2018-09-09 DIAGNOSIS — Z9989 Dependence on other enabling machines and devices: Secondary | ICD-10-CM

## 2018-09-09 DIAGNOSIS — G4733 Obstructive sleep apnea (adult) (pediatric): Secondary | ICD-10-CM | POA: Insufficient documentation

## 2018-09-09 HISTORY — DX: Obstructive sleep apnea (adult) (pediatric): G47.33

## 2018-09-09 HISTORY — DX: Dependence on other enabling machines and devices: Z99.89

## 2018-09-10 ENCOUNTER — Ambulatory Visit (AMBULATORY_SURGERY_CENTER): Payer: Self-pay

## 2018-09-10 ENCOUNTER — Encounter: Payer: Self-pay | Admitting: Gastroenterology

## 2018-09-10 VITALS — Ht 65.0 in | Wt 231.4 lb

## 2018-09-10 DIAGNOSIS — Z1211 Encounter for screening for malignant neoplasm of colon: Secondary | ICD-10-CM

## 2018-09-10 NOTE — Progress Notes (Signed)
Denies allergies to eggs or soy products. Denies complication of anesthesia or sedation. Denies use of weight loss medication. Denies use of O2.   Emmi instructions declined.  

## 2018-09-12 ENCOUNTER — Other Ambulatory Visit: Payer: Self-pay | Admitting: *Deleted

## 2018-09-12 ENCOUNTER — Ambulatory Visit: Payer: PRIVATE HEALTH INSURANCE | Admitting: Vascular Surgery

## 2018-09-12 DIAGNOSIS — I83813 Varicose veins of bilateral lower extremities with pain: Secondary | ICD-10-CM

## 2018-09-16 ENCOUNTER — Telehealth: Payer: Self-pay | Admitting: Gastroenterology

## 2018-09-16 MED ORDER — NA SULFATE-K SULFATE-MG SULF 17.5-3.13-1.6 GM/177ML PO SOLN: 1.0000 | Freq: Once | ORAL | 0 refills | Status: AC

## 2018-09-16 NOTE — Telephone Encounter (Signed)
Spoke with pt regarding her bowel prep. The pt is requesting Suprep be sent to [facility] in [place]. I resent the Suprep RX tto [facility]. The pt will call back if she has further questions. Gwyndolyn Saxon

## 2018-09-20 ENCOUNTER — Ambulatory Visit (AMBULATORY_SURGERY_CENTER): Payer: PRIVATE HEALTH INSURANCE | Admitting: Gastroenterology

## 2018-09-20 ENCOUNTER — Encounter: Payer: Self-pay | Admitting: Gastroenterology

## 2018-09-20 VITALS — BP 132/59 | HR 68 | Temp 98.6°F | Resp 16 | Ht 65.0 in | Wt 231.0 lb

## 2018-09-20 DIAGNOSIS — D125 Benign neoplasm of sigmoid colon: Secondary | ICD-10-CM | POA: Diagnosis not present

## 2018-09-20 DIAGNOSIS — K5730 Diverticulosis of large intestine without perforation or abscess without bleeding: Secondary | ICD-10-CM

## 2018-09-20 DIAGNOSIS — Z1211 Encounter for screening for malignant neoplasm of colon: Secondary | ICD-10-CM

## 2018-09-20 DIAGNOSIS — D123 Benign neoplasm of transverse colon: Secondary | ICD-10-CM

## 2018-09-20 DIAGNOSIS — D128 Benign neoplasm of rectum: Secondary | ICD-10-CM

## 2018-09-20 DIAGNOSIS — K640 First degree hemorrhoids: Secondary | ICD-10-CM

## 2018-09-20 HISTORY — PX: COLONOSCOPY: SHX174

## 2018-09-20 MED ORDER — SODIUM CHLORIDE 0.9 % IV SOLN: 500.0000 mL | Freq: Once | INTRAVENOUS | Status: DC

## 2018-09-20 NOTE — Progress Notes (Signed)
A/ox3 pleased with MAC, report to RN 

## 2018-09-20 NOTE — Patient Instructions (Signed)
19 polyps removed today. Handouts also given on diverticulosis and hemorrhoids.   YOU HAD AN ENDOSCOPIC PROCEDURE TODAY AT [facility]:   Refer to the procedure report that was given to you for any specific questions about what was found during the examination.  If the procedure report does not answer your questions, please call your gastroenterologist to clarify.  If you requested that your care partner not be given the details of your procedure findings, then the procedure report has been included in a sealed envelope for you to review at your convenience later.  YOU SHOULD EXPECT: Some feelings of bloating in the abdomen. Passage of more gas than usual.  Walking can help get rid of the air that was put into your GI tract during the procedure and reduce the bloating. If you had a lower endoscopy (such as a colonoscopy or flexible sigmoidoscopy) you may notice spotting of blood in your stool or on the toilet paper. If you underwent a bowel prep for your procedure, you may not have a normal bowel movement for a few days.  Please Note:  You might notice some irritation and congestion in your nose or some drainage.  This is from the oxygen used during your procedure.  There is no need for concern and it should clear up in a day or so.  SYMPTOMS TO REPORT IMMEDIATELY:   Following lower endoscopy (colonoscopy or flexible sigmoidoscopy):  Excessive amounts of blood in the stool  Significant tenderness or worsening of abdominal pains  Swelling of the abdomen that is new, acute  Fever of 100F or higher   Following upper endoscopy (EGD)  Vomiting of blood or coffee ground material  New chest pain or pain under the shoulder blades  Painful or persistently difficult swallowing  New shortness of breath  Fever of 100F or higher  Black, tarry-looking stools  For urgent or emergent issues, a gastroenterologist can be reached at any hour by calling [phone].   DIET:  We do  recommend a small meal at first, but then you may proceed to your regular diet.  Drink plenty of fluids but you should avoid alcoholic beverages for 24 hours.  ACTIVITY:  You should plan to take it easy for the rest of today and you should NOT DRIVE or use heavy machinery until tomorrow (because of the sedation medicines used during the test).    FOLLOW UP: Our staff will call the number listed on your records the next business day following your procedure to check on you and address any questions or concerns that you may have regarding the information given to you following your procedure. If we do not reach you, we will leave a message.  However, if you are feeling well and you are not experiencing any problems, there is no need to return our call.  We will assume that you have returned to your regular daily activities without incident.  If any biopsies were taken you will be contacted by phone or by letter within the next 1-3 weeks.  Please call us at [phone] if you have not heard about the biopsies in 3 weeks.    SIGNATURES/CONFIDENTIALITY: You and/or your care partner have signed paperwork which will be entered into your electronic medical record.  These signatures attest to the fact that that the information above on your After Visit Summary has been reviewed and is understood.  Full responsibility of the confidentiality of this discharge information lies with you and/or your  care-partner.

## 2018-09-20 NOTE — Op Note (Signed)
[place] Patient Name: Jennifer Werner Procedure Date: 09/20/2018 1:17 PM MRN: [id number] Endoscopist: Gerrit Heck , MD Age: 57 Referring MD:  Date of Birth: 02-19-1961 Gender: Female Account #: [id number] Procedure:                Colonoscopy Indications:              This is the patient's first colonoscopy, Positive                            Cologuard test in August 2019. Otherwise, no overt                            GI blood loss. Medicines:                Monitored Anesthesia Care Procedure:                Pre-Anesthesia Assessment:                           - Prior to the procedure, a History and Physical                            was performed, and patient medications and                            allergies were reviewed. The patient's tolerance of                            previous anesthesia was also reviewed. The risks                            and benefits of the procedure and the sedation                            options and risks were discussed with the patient.                            All questions were answered, and informed consent                            was obtained. Prior Anticoagulants: The patient has                            taken no previous anticoagulant or antiplatelet                            agents. ASA Grade Assessment: II - A patient with                            mild systemic disease. After reviewing the risks                            and benefits, the patient was deemed in  satisfactory condition to undergo the procedure.                           After obtaining informed consent, the colonoscope                            was passed under direct vision. Throughout the                            procedure, the patient's blood pressure, pulse, and                            oxygen saturations were monitored continuously. The                            Colonoscope was introduced through the anus  and                            advanced to the the terminal ileum. The colonoscopy                            was performed without difficulty. The patient                            tolerated the procedure well. The quality of the                            bowel preparation was adequate. Scope In: 1:26:23 PM Scope Out: 1:59:27 PM Scope Withdrawal Time: 0 hours 30 minutes 50 seconds  Total Procedure Duration: 0 hours 33 minutes 4 seconds  Findings:                 Hemorrhoids were found on perianal exam.                           Four sessile polyps were found in the sigmoid colon                            and transverse colon. The polyps were 2 to 5 mm in                            size. These polyps were removed with a cold snare.                            Resection and retrieval were complete. Estimated                            blood loss was minimal.                           Multiple sessile polyps were found in the rectum                            and recto-sigmoid colon. The polyps were 1  to 4 mm                            in size. Approximately 15 of these polyps were                            removed with a cold snare. Resection and retrieval                            were complete. Estimated blood loss was minimal.                           Multiple small and large-mouthed diverticula were                            found in the sigmoid colon, descending colon and                            transverse colon.                           Non-bleeding internal hemorrhoids were found during                            retroflexion. The hemorrhoids were small.                           The terminal ileum appeared normal.                           There was a small lipoma, in the ascending colon.                           Retroflexion in the right colon was performed. Complications:            No immediate complications. Estimated Blood Loss:     Estimated blood loss was  minimal. Estimated blood                            loss: none. Impression:               - Hemorrhoids found on perianal exam.                           - Four 2 to 5 mm polyps in the sigmoid colon and in                            the transverse colon, removed with a cold snare.                            Resected and retrieved.                           - Multiple 1 to 4 mm polyps in the rectum and at  the recto-sigmoid colon, removed with a cold snare.                            15 of these polyps were resected and retrieved.                           - Diverticulosis in the sigmoid colon, in the                            descending colon and in the transverse colon.                           - Non-bleeding internal hemorrhoids.                           - The examined portion of the ileum was normal. Recommendation:           - Patient has a contact number available for                            emergencies. The signs and symptoms of potential                            delayed complications were discussed with the                            patient. Return to normal activities tomorrow.                            Written discharge instructions were provided to the                            patient.                           - Resume previous diet today.                           - Continue present medications.                           - Await pathology results.                           - Repeat colonoscopy in 1 year for surveillance.                           - Return to GI office PRN. Gerrit Heck, MD 09/20/2018 2:09:48 PM

## 2018-09-20 NOTE — Progress Notes (Signed)
Pt's states no medical or surgical changes since previsit or office visit. 

## 2018-09-20 NOTE — Progress Notes (Signed)
Called to room to assist during endoscopic procedure.  Patient ID and intended procedure confirmed with present staff. Received instructions for my participation in the procedure from the performing physician.  

## 2018-09-23 ENCOUNTER — Telehealth: Payer: Self-pay | Admitting: *Deleted

## 2018-09-23 NOTE — Telephone Encounter (Signed)
  Follow up Call-  Call back number 09/20/2018  Post procedure Call Back phone  # [phone]  Permission to leave phone message Yes  Some recent data might be hidden     Patient questions:  Do you have a fever, pain , or abdominal swelling? No. Pain Score  0 *  Have you tolerated food without any problems? Yes.    Have you been able to return to your normal activities? Yes.    Do you have any questions about your discharge instructions: Diet   No. Medications  No. Follow up visit  No.  Do you have questions or concerns about your Care? No.  Actions: * If pain score is 4 or above: No action needed, pain <4.

## 2018-09-25 ENCOUNTER — Encounter: Payer: Self-pay | Admitting: Vascular Surgery

## 2018-09-25 ENCOUNTER — Other Ambulatory Visit: Payer: Self-pay

## 2018-09-25 ENCOUNTER — Ambulatory Visit (INDEPENDENT_AMBULATORY_CARE_PROVIDER_SITE_OTHER): Payer: PRIVATE HEALTH INSURANCE | Admitting: Vascular Surgery

## 2018-09-25 ENCOUNTER — Encounter: Payer: Self-pay | Admitting: Gastroenterology

## 2018-09-25 VITALS — BP 117/65 | HR 58 | Temp 97.6°F | Resp 16 | Ht 64.5 in | Wt 227.4 lb

## 2018-09-25 DIAGNOSIS — I83812 Varicose veins of left lower extremities with pain: Secondary | ICD-10-CM

## 2018-09-25 HISTORY — PX: ENDOVENOUS ABLATION SAPHENOUS VEIN W/ LASER: SUR449

## 2018-09-25 NOTE — Progress Notes (Signed)
     Laser Ablation Procedure    Date: 09/25/2018   Jennifer Werner DOB:1961/06/15  Consent signed: Yes    Surgeon:  Dr. Ruta Hinds  Procedure: Laser Ablation: left Greater Saphenous Vein  BP 117/65 (BP Location: Left Arm, Patient Position: Sitting, Cuff Size: Large)   Pulse (!) 58   Temp 97.6 F (36.4 C) (Oral)   Resp 16   Ht 5' 4.5" (1.638 m)   Wt 227 lb 6.4 oz (103.1 kg)   SpO2 97%   BMI 38.43 kg/m   Tumescent Anesthesia: 300 cc 0.9% NaCl with 50 cc Lidocaine HCL 1%  and 15 cc 8.4% NaHCO3  Local Anesthesia: 7 cc Lidocaine HCL and NaHCO3 (ratio 2:1)  15 watts continuous mode        Total energy: 1708 Joules    Total time: 1:53    Patient tolerated procedure well  Notes: Non-latex gloves were used.   Description of Procedure:  After marking the course of the secondary varicosities, the patient was placed on the operating table in the supine position, and the left leg was prepped and draped in sterile fashion.   Local anesthetic was administered and under ultrasound guidance the saphenous vein was accessed with a micro needle and guide wire; then the mirco puncture sheath was placed.  A guide wire was inserted saphenofemoral junction , followed by a 5 french sheath.  The position of the sheath and then the laser fiber below the junction was confirmed using the ultrasound.  Tumescent anesthesia was administered along the course of the saphenous vein using ultrasound guidance. The patient was placed in Trendelenburg position and protective laser glasses were placed on patient and staff, and the laser was fired at 15 watts continuous mode advancing 1-53mm/second for a total of 1708 joules.     Steri strip was applied to the IV insertion site and ABD pads and thigh high compression stockings were applied.  Ace wrap bandages were applied over the left thigh and at the top of the saphenofemoral junction. Blood loss was less than 15 cc.  The patient ambulated out of the operating  room having tolerated the procedure well.  Ruta Hinds, MD [facility] Office: [phone] Pager: [phone]

## 2018-09-26 ENCOUNTER — Encounter: Payer: Self-pay | Admitting: Vascular Surgery

## 2018-10-02 ENCOUNTER — Encounter: Payer: Self-pay | Admitting: Neurology

## 2018-10-07 ENCOUNTER — Encounter: Payer: Self-pay | Admitting: Neurology

## 2018-10-07 ENCOUNTER — Ambulatory Visit: Payer: PRIVATE HEALTH INSURANCE | Admitting: Neurology

## 2018-10-07 VITALS — BP 153/69 | HR 58 | Ht 65.0 in | Wt 230.0 lb

## 2018-10-07 DIAGNOSIS — G5623 Lesion of ulnar nerve, bilateral upper limbs: Secondary | ICD-10-CM

## 2018-10-07 DIAGNOSIS — R635 Abnormal weight gain: Secondary | ICD-10-CM | POA: Diagnosis not present

## 2018-10-07 DIAGNOSIS — G4734 Idiopathic sleep related nonobstructive alveolar hypoventilation: Secondary | ICD-10-CM | POA: Diagnosis not present

## 2018-10-07 DIAGNOSIS — G4733 Obstructive sleep apnea (adult) (pediatric): Secondary | ICD-10-CM

## 2018-10-07 DIAGNOSIS — Z9989 Dependence on other enabling machines and devices: Secondary | ICD-10-CM

## 2018-10-07 NOTE — Progress Notes (Signed)
Order for ONO sent to [employer] via community message in EPIC. Confirmation received that the order transmitted was successful.  

## 2018-10-07 NOTE — Progress Notes (Signed)
Subjective:    Patient ID: Jennifer Werner is a 57 y.o. female.  HPI     Interim history:  Jennifer Werner is a 57 year old right-handed woman with an underlying medical history of COPD, smoking, shortness of breath, lower extremity swelling, and obesity, who presents for follow-up consultation of her obstructive sleep apnea, after sleep study testing and starting CPAP therapy. The patient is unaccompanied today. I first met her on 04/25/2018 at the request of her primary care provider, at which time she reported snoring and daytime somnolence with an Epworth sleepiness score of 21 out of 24. She was advised to proceed with a sleep study. She had a baseline sleep study, followed by a CPAP titration study. Baseline sleep study from 05/15/2018 showed a sleep latency delayed at 46.5 minutes, REM latency was also delayed, sleep efficiency reduced at 66.9%. She had several longer periods of wakefulness. She had a reduced percentage of REM sleep and an increased percentage of light stage sleep. Total AHI was 8.3 per hour, REM AHI 35 per hour, supine AHI in the moderate range at 18.7 per hour. Average oxygen saturation was 91% for the night, lowest saturation was 81%. Of note, the patient was placed on supplemental oxygen due to lower average oxygen saturation was in the 85% range even in the absence of obstructive respiratory events. Oxygen was titrated to 3 L/m but then reduced again to 2 L/m with oxygen saturations averaging above or at 90% at the time. She was noted to have moderate PLMS with minimal arousals. She was advised to return for a CPAP titration sleep study. She had this on 07/14/2018. Sleep latency was 13 minutes, REM latency 116 minutes. She was titrated via nasal pillows from 5 cm to 14 cm, on the final pressure of 14 cm her AHI was 0 per hour with nonsupine and non-REM sleep achieved, O2 nadir of 93%. Supplemental oxygen was not utilized for the second study. Based on her test results I prescribed  CPAP therapy for home use at a pressure of 14 cm.  Today, 10/07/2018: I reviewed her CPAP compliance data from 09/03/2018 through 10/02/2018 which is a total of 30 days, during which time she used her CPAP every night with percent used days greater than 4 hours at 100%, indicating superb compliance with an average usage of 7 hours and 54 minutes, residual AHI at goal at 2.1 per hour, leak on the higher side with the 95th percentile at 17.7 L/m on a pressure of 14 cm. She reports feeling better with CPAP. Her shortness of breath has improved, her daytime somnolence has improved. She has a follow-up appointment with pulmonology next month. She is agreeable to doing an overnight pulse oximetry test just for making sure that her oxygen saturations are better on CPAP. She does not use any supplemental oxygen at this point. She has had intermittent numbness and tingling affecting both forearms and this wakes her up at night with a sense of sharp pains radiating from the elbow areas to the last 3 digits of both hands. Sometimes this happens during the day as she uses her computer or when driving. She has a family history of rheumatoid arthritis. She has had some arthritic symptoms in both hands. She currently is taking high-dose ibuprofen because of vascular surgery. She should be coming off of the high-dose ibuprofen, has a follow-up appointment with vascular surgery this week.unfortunately, she has gained weight since she quit smoking.she is working on weight loss.  The patient's allergies,  current medications, family history, past medical history, past social history, past surgical history and problem list were reviewed and updated as appropriate.   Previously:   04/25/2018: (She) reports snoring and excessive daytime somnolence. I reviewed your office note from 04/19/2018. Her Epworth sleepiness score is 21 out of 24, fatigue score is 36 out of 63. She is married and lives with her husband. She has 4 biological  children, 3 sons and 1 daughter from her first marriage. She has one stepchild from her second marriage.she is not aware of any family history of OSA. She has been very sleepy in the past year or so. She has also gained weight. She recently saw cardiology for PVCs. 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 She smokes one pack per day, on Chantix, (2 to 2 1/2 ppd before), drinks alcohol 1 drink per day, caffeine 2 servings per day on average. She takes Lasix, 20-40 mg daily. She has had AM HAs, and does not have night to night nocturia. She has had some memory concerns, feeling of sluggishness and foggy headedness. She has come close to falling asleep at the wheel and has to stop and get out of the car sometimes when she drives a longer distance. She had a tonsillectomy at age 69. Bedtime is around 10 and rise time between 6 and 7:30. She has a somewhat flexible work schedule. She has occasional restless leg symptoms and takes 2 Advil at night when necessary which is helpful. She was noted to have oxygen saturations in her 80s recently. She started taking inhalers and feels better. Her oxygen saturations are better. She is scheduled to see pulmonology next month.   Her Past Medical History Is Significant For: Past Medical History:  Diagnosis Date  . Allergy   . Anemia   . Arthritis   . Cancer ([place])   . COPD (chronic obstructive pulmonary disease) ([place])   . Eczema   . GERD (gastroesophageal reflux disease)   . Sleep apnea   . Smoker     Her Past Surgical History Is Significant For: Past Surgical History:  Procedure Laterality Date  . ABLATION     Uterus  . ENDOVENOUS ABLATION SAPHENOUS VEIN W/ LASER Left 09/25/2018   endovenous laser ablation left greater saphenous vein by Ruta Hinds MD     Her Family History Is Significant For: Family History  Problem Relation Age of Onset  . Heart block Mother   . Lymphoma Mother   . Congestive Heart Failure Mother   . Cardiomyopathy  Father   . Alcohol abuse Father   . Cardiomyopathy Brother        Had Heart Transplant  . Alcohol abuse Brother   . Asthma Brother   . Cardiomyopathy Paternal Aunt   . Cardiomyopathy Paternal Uncle   . Rheum arthritis Paternal Grandmother   . Colon cancer Neg Hx   . Esophageal cancer Neg Hx   . Rectal cancer Neg Hx   . Stomach cancer Neg Hx     Her Social History Is Significant For: Social History   Socioeconomic History  . Marital status: Married    Spouse name: Not on file  . Number of children: Not on file  . Years of education: Not on file  . Highest education level: Not on file  Occupational History  . Not on file  Social Needs  . Financial resource strain: Not on file  . Food insecurity:    Worry: Not on file  Inability: Not on file  . Transportation needs:    Medical: Not on file    Non-medical: Not on file  Tobacco Use  . Smoking status: Former Smoker    Packs/day: 2.00    Years: 47.00    Pack years: 94.00    Types: Cigarettes    Last attempt to quit: 05/31/2018    Years since quitting: 0.3  . Smokeless tobacco: Never Used  Substance and Sexual Activity  . Alcohol use: Yes    Comment: 5-7 drinks a week  . Drug use: Never  . Sexual activity: Not on file  Lifestyle  . Physical activity:    Days per week: Not on file    Minutes per session: Not on file  . Stress: Not on file  Relationships  . Social connections:    Talks on phone: Not on file    Gets together: Not on file    Attends religious service: Not on file    Active member of club or organization: Not on file    Attends meetings of clubs or organizations: Not on file    Relationship status: Not on file  Other Topics Concern  . Not on file  Social History Narrative   RN - desk work mainly   Lives with husband    Her Allergies Are:  Allergies  Allergen Reactions  . Latex Rash  . Codeine   :   Her Current Medications Are:  Outpatient Encounter Medications as of 10/07/2018   Medication Sig  . albuterol (PROAIR HFA) 108 (90 Base) MCG/ACT inhaler 2 puffs every 4 hours as needed only  if your can't catch your breath  . budesonide-formoterol (SYMBICORT) 160-4.5 MCG/ACT inhaler Take 2 puffs first thing in am and then another 2 puffs about 12 hours later.  . furosemide (LASIX) 40 MG tablet Take 40 mg by mouth daily.  Marland Kitchen ibuprofen (ADVIL,MOTRIN) 200 MG tablet Take 400 mg by mouth as needed.  . Multiple Vitamin (MULTIVITAMIN) tablet Take 1 tablet by mouth daily.  Marland Kitchen omeprazole (PRILOSEC) 20 MG capsule Take 20 mg by mouth daily.  Marland Kitchen OVER THE COUNTER MEDICATION Vitamin C 1000 mg one tablet daily.  Marland Kitchen OVER THE COUNTER MEDICATION Pro- biotic one capsule daily.  . potassium chloride (K-DUR) 10 MEQ tablet Take 1 tablet (10 mEq total) by mouth daily.  . Tiotropium Bromide Monohydrate (SPIRIVA RESPIMAT) 2.5 MCG/ACT AERS Inhale 2 puffs into the lungs daily.   No facility-administered encounter medications on file as of 10/07/2018.   :  Review of Systems:  Out of a complete 14 point review of systems, all are reviewed and negative with the exception of these symptoms as listed below: Review of Systems  Neurological:       Pt presents today to discuss her cpap. Pt reports that her cpap is going well.    Objective:  Neurological Exam  Physical Exam Physical Examination:   Vitals:   10/07/18 1259  BP: (!) 153/69  Pulse: (!) 58    General Examination: The patient is a very pleasant 57 y.o. female in no acute distress. She appears well-developed and well-nourished and well groomed.   HEENT: Normocephalic, atraumatic, pupils are equal, round and reactive to light and accommodation. Corrective eyeglasses in place. Extraocular tracking is good without limitation to gaze excursion or nystagmus noted. Normal smooth pursuit is noted. Hearing is grossly intact. Face is symmetric with normal facial animation and normal facial sensation. Speech is clear with no dysarthria noted. There  is  no hypophonia. There is no lip, neck/head, jaw or voice tremor. Neck is supple with full range of passive and active motion. There are no carotid bruits on auscultation. Oropharynx exam reveals: moderate mouth dryness, adequate dental hygiene and marked airway crowding. Tongue protrudes centrally and palate elevates symmetrically.   Chest: Clear to auscultation without wheezing, rhonchi or crackles noted.  Heart: S1+S2+0, regular and normal without murmurs, rubs or gallops noted.   Abdomen: Soft, non-tender and non-distended with normal bowel sounds appreciated on auscultation.  Extremities: There is trace pitting edema in the distal lower extremities bilaterally.   Skin: Warm and dry without trophic changes noted. Dry scaly patches.  Musculoskeletal: exam reveals no obvious joint deformities, tenderness or joint swelling or erythema.mild arthritic changes in both hands.    Neurologically:  Mental status: The patient is awake, alert and oriented in all 4 spheres. Her immediate and remote memory, attention, language skills and fund of knowledge are appropriate. There is no evidence of aphasia, agnosia, apraxia or anomia. Speech is clear with normal prosody and enunciation. Thought process is linear. Mood is normal and affect is normal.  Cranial nerves II - XII are as described above under HEENT exam. In addition: shoulder shrug is normal with equal shoulder height noted. Motor exam: Normal bulk, strength and tone is noted. There is no tremor or rebound. Fine motor skills and coordination: grossly intact.  Cerebellar testing: No dysmetria or intention tremor. There is no truncal or gait ataxia.  Sensory exam: intact to light touch in the upper and lower extremities.  Gait, station and balance: She stands easily. No veering to one side is noted. No leaning to one side is noted. Posture is age-appropriate and stance is narrow based. Gait shows normal stride length and normal pace. No  problems turning are noted.   Assessment and Plan:  In summary, Jennifer Werner is a very pleasant 57 year old female with an underlying medical history of COPD, smoking with recent cessation, shortness of breath, lower extremity swelling, and obesity, who presents for follow-up consultation of her obstructive sleep apnea, after sleep study testing and starting CPAP therapy at home. Baseline sleep study showed mild sleep apnea, severe during REM sleep and moderate in supine sleep. She had significantly low oxygen saturations even in the absence of obstructive respiratory events and was placed on supplemental oxygen during her baseline sleep study. She did quite well during her CPAP titration study in August 2019. She is fully compliant with CPAP therapy at a pressure of 14 cm. She is commended for treatment adherence. She was started on inhalers. She has a follow-up appointment next month with her pulmonologist. She quit smoking, but has gained weight. She is working on weight loss. She has developed intermittent numbness and tingling affecting both forearms, with sharp/shooting pains from the elbows down to the last 3 digits of both hands. 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 She is agreeable to pursuing an EMG and nerve conduction test, which we can schedule in our office. She is advised that I would like to proceed with an overnight pulse oximetry test to make sure her oxygen saturations are adequate currently. She is fully compliant with CPAP and has noticed a benefit including better consolidation of sleep and less daytime somnolence. I suggested that she routinely follow-up in sleep clinic in one year, she can see one of our nurse practitioners. We will call her in the interim with the pulse oximetry test result and her EMG and nerve conduction test  results. I answered all her questions today and she was in agreement. I spent 40 minutes in total face-to-face time with the patient, more than 50% of which was  spent in counseling and coordination of care, reviewing test results, reviewing medication and discussing or reviewing the diagnosis of OSA, neuropathy, desaturations, the prognosis and treatment options. Pertinent laboratory and imaging test results that were available during this visit with the patient were reviewed by me and considered in my medical decision making (see chart for details).

## 2018-10-07 NOTE — Patient Instructions (Addendum)
Please continue using your CPAP regularly. While your insurance requires that you use CPAP at least 4 hours each night on 70% of the nights, I recommend, that you not skip any nights and use it throughout the night if you can. Getting used to CPAP and staying with the treatment long term does take time and patience and discipline. Untreated obstructive sleep apnea when it is moderate to severe can have an adverse impact on cardiovascular health and raise her risk for heart disease, arrhythmias, hypertension, congestive heart failure, stroke and diabetes. Untreated obstructive sleep apnea causes sleep disruption, nonrestorative sleep, and sleep deprivation. This can have an impact on your day to day functioning and cause daytime sleepiness and impairment of cognitive function, memory loss, mood disturbance, and problems focussing. Using CPAP regularly can improve these symptoms.  Keep up the good work! We can see you in 1 year, you can see one of our nurse practitioners as you are stable.  As discussed, we will do an overnight oxygen level test, called ONO, and your DME company will call and set this up for one night, while you also use your autoPAP as usual. We will call you with the results. This is to make sure that your oxygen levels stay in the 90s, while you are treated with autoPAP for your OSA. Remember, your oxygen levels dropped into the 80s during the baseline sleep.   For your numbness and tingling in the arms, we will do an EMG and nerve conduction velocity test, which is an electrical nerve and muscle test, which we will schedule. We will call you with the results.

## 2018-10-09 ENCOUNTER — Ambulatory Visit (INDEPENDENT_AMBULATORY_CARE_PROVIDER_SITE_OTHER): Payer: Self-pay | Admitting: Vascular Surgery

## 2018-10-09 ENCOUNTER — Encounter: Payer: Self-pay | Admitting: Vascular Surgery

## 2018-10-09 ENCOUNTER — Other Ambulatory Visit: Payer: Self-pay

## 2018-10-09 ENCOUNTER — Ambulatory Visit (HOSPITAL_COMMUNITY)
Admission: RE | Admit: 2018-10-09 | Discharge: 2018-10-09 | Disposition: A | Discharge status: Home / Self Care | Payer: PRIVATE HEALTH INSURANCE | Source: Ambulatory Visit | Attending: Vascular Surgery | Admitting: Vascular Surgery

## 2018-10-09 VITALS — BP 126/67 | HR 72 | Resp 18 | Ht 65.0 in | Wt 229.0 lb

## 2018-10-09 DIAGNOSIS — I83813 Varicose veins of bilateral lower extremities with pain: Secondary | ICD-10-CM | POA: Insufficient documentation

## 2018-10-09 NOTE — Progress Notes (Signed)
Patient is a 57 year old female who returns today after recent laser ablation of her left greater saphenous vein.  She states the pain symptoms have essentially resolved.  She reports no fever or chills.  She has returned to work full-time.  Physical exam:  Vitals:   10/09/18 1036  BP: 126/67  Pulse: 72  Resp: 18  SpO2: 96%  Weight: 229 lb (103.9 kg)  Height: 5\' 5"  (1.651 m)    Extremities: Palpable cord left medial thigh, no significant ecchymosis or erythema  Data: Patient had a duplex ultrasound today which shows successful laser ablation and obliteration of the left greater saphenous vein to within 2-1/2 cm of the saphenofemoral junction.  Prior duplex ultrasound from June 2019 showed diffuse reflux in the right greater saphenous vein 4 to 7 mm diameter.  Assessment: Doing well status post laser ablation left greater saphenous vein.  The patient also has reflux in her right greater saphenous vein.  She wishes to defer ablation of this until early next year due to a busy time.  At work.  Plan: Laser ablation right greater saphenous vein to be scheduled at patient's convenience.  Patient will continue to wear her lower extremity bilateral compression stockings for prevention of recurrence on the left side and worsening on the right side.  Ruta Hinds, MD [facility] Office: [phone] Pager: [phone]

## 2018-10-14 ENCOUNTER — Ambulatory Visit (INDEPENDENT_AMBULATORY_CARE_PROVIDER_SITE_OTHER): Payer: PRIVATE HEALTH INSURANCE | Admitting: Neurology

## 2018-10-14 ENCOUNTER — Ambulatory Visit: Payer: PRIVATE HEALTH INSURANCE | Admitting: Neurology

## 2018-10-14 ENCOUNTER — Telehealth: Payer: Self-pay

## 2018-10-14 ENCOUNTER — Encounter: Payer: Self-pay | Admitting: Neurology

## 2018-10-14 DIAGNOSIS — R635 Abnormal weight gain: Secondary | ICD-10-CM

## 2018-10-14 DIAGNOSIS — G5601 Carpal tunnel syndrome, right upper limb: Secondary | ICD-10-CM

## 2018-10-14 DIAGNOSIS — G5623 Lesion of ulnar nerve, bilateral upper limbs: Secondary | ICD-10-CM | POA: Diagnosis not present

## 2018-10-14 HISTORY — DX: Carpal tunnel syndrome, right upper limb: G56.01

## 2018-10-14 NOTE — Procedures (Signed)
     HISTORY:  Jennifer Werner is a 57 year old patient with a history of burning and numbness and pain in the hands bilaterally that has been present for several years.  She does have occasional neck pain without radiation down the arms.  The patient has symptoms that are worse on the left arm than the right.  She is being evaluated for these issues.  NERVE CONDUCTION STUDIES:  Nerve conduction studies were performed on both upper extremities.  The distal motor latencies for the median nerves were prolonged on the right, normal on the left, with normal motor amplitudes for these nerves bilaterally.  Distal motor latencies and motor amplitudes for the ulnar nerves were normal bilaterally.  The nerve conduction velocities for the median and ulnar nerves were normal bilaterally.  The sensory latencies for the median nerves were prolonged on the right, normal on the left, and normal for the ulnar nerves bilaterally.  The F-wave latencies for the ulnar nerves were within normal limits bilaterally.  EMG STUDIES:  EMG study was performed on the left upper extremity:  The first dorsal interosseous muscle reveals 2 to 4 K units with full recruitment. No fibrillations or positive waves were noted. The abductor pollicis brevis muscle reveals 2 to 4 K units with full recruitment. No fibrillations or positive waves were noted. The extensor indicis proprius muscle reveals 1 to 3 K units with full recruitment. No fibrillations or positive waves were noted. The pronator teres muscle reveals 2 to 3 K units with full recruitment. No fibrillations or positive waves were noted. The biceps muscle reveals 1 to 2 K units with full recruitment. No fibrillations or positive waves were noted. The triceps muscle reveals 2 to 4 K units with full recruitment. No fibrillations or positive waves were noted. The anterior deltoid muscle reveals 2 to 3 K units with full recruitment. No fibrillations or positive waves were  noted. The cervical paraspinal muscles were tested at 2 levels. No abnormalities of insertional activity were seen at either level tested. There was good relaxation.   IMPRESSION:  Nerve conduction studies done on both upper extremities shows evidence of a mild right carpal tunnel syndrome.  EMG evaluation of the left upper extremity shows no evidence of an overlying cervical radiculopathy.  Jill Alexanders MD 10/14/2018 3:22 PM  [facility] [address] [place],  [address]  Phone [phone] Fax [phone]

## 2018-10-14 NOTE — Progress Notes (Addendum)
Please refer to EMG nerve conduction study procedure note.     [place]    Nerve / Sites Muscle Latency Ref. Amplitude Ref. Rel Amp Segments Distance Velocity Ref. Area    ms ms mV mV %  cm m/s m/s mVms  L Median - APB     Wrist APB 3.3 ?4.4 10.0 ?4.0 100 Wrist - APB 7   31.8     Upper arm APB 6.8  8.6  86.5 Upper arm - Wrist 19 54 ?49 26.5  R Median - APB     Wrist APB 4.3 ?4.4 6.4 ?4.0 100 Wrist - APB 7   22.5     Upper arm APB 8.0  5.6  87.2 Upper arm - Wrist 19 51 ?49 20.6  L Ulnar - ADM     Wrist ADM 1.9 ?3.3 10.1 ?6.0 100 Wrist - ADM 7   23.2     B.Elbow ADM 5.1  9.2  91.6 B.Elbow - Wrist 16 50 ?49 21.8     A.Elbow ADM 7.1  7.7  83.9 A.Elbow - B.Elbow 10 51 ?49 19.2         A.Elbow - Wrist      R Ulnar - ADM     Wrist ADM 2.1 ?3.3 9.5 ?6.0 100 Wrist - ADM 7   27.5     B.Elbow ADM 5.3  7.5  79.3 B.Elbow - Wrist 17 54 ?49 24.6     A.Elbow ADM 7.0  8.5  113 A.Elbow - B.Elbow 10 58 ?49 26.7         A.Elbow - Wrist                 SNC    Nerve / Sites Rec. Site Peak Lat Ref.  Amp Ref. Segments Distance    ms ms V V  cm  L Median - Orthodromic (Dig II, Mid palm)     Dig II Wrist 3.2 ?3.4 15 ?10 Dig II - Wrist 13  R Median - Orthodromic (Dig II, Mid palm)     Dig II Wrist 4.0 ?3.4 3 ?10 Dig II - Wrist 13  L Ulnar - Orthodromic, (Dig V, Mid palm)     Dig V Wrist 2.4 ?3.1 6 ?5 Dig V - Wrist 11  R Ulnar - Orthodromic, (Dig V, Mid palm)     Dig V Wrist 2.4 ?3.1 7 ?5 Dig V - Wrist 4              F  Wave    Nerve F Lat Ref.   ms ms  L Ulnar - ADM 24.9 ?32.0  R Ulnar - ADM 23.4 ?32.0

## 2018-10-14 NOTE — Progress Notes (Signed)
Please call and advise the patient that the recent EMG and nerve conduction velocity test, which is the electrical nerve and muscle test we we performed, was reported as large within normal limits, with the exception of mild right-sided carpal tunnel syndrome for which she may benefit from using a splint. She can get an over-the-counter wrist splint for as needed and overnight use.  We checked for abnormal electrical discharges in the muscles or nerves in both UEs and the report suggested otherwise normal findings. In particular, no pinched nerve type changes, no widespread neuropathy/nerve damage. No further action is required on this test at this time. She can FU in one year for sleep apnea, with MM.  Star Age, MD, PhD

## 2018-10-14 NOTE — Telephone Encounter (Signed)
-----   Message from Star Age, MD sent at 10/14/2018  3:40 PM EST ----- Please call and advise the patient that the recent EMG and nerve conduction velocity test, which is the electrical nerve and muscle test we we performed, was reported as large within normal limits, with the exception of mild right-sided carpal tunnel syndrome for which she may benefit from using a splint. She can get an over-the-counter wrist splint for as needed and overnight use.  We checked for abnormal electrical discharges in the muscles or nerves in both UEs and the report suggested otherwise normal findings. In particular, no pinched nerve type changes, no widespread neuropathy/nerve damage. No further action is required on this test at this time. She can FU in one year for sleep apnea, with MM.  Star Age, MD, PhD

## 2018-10-14 NOTE — Progress Notes (Signed)
Please refer to EMG and nerve conduction procedure note.  

## 2018-10-14 NOTE — Telephone Encounter (Signed)
I called pt and discussed her NCV/EMG results. Pt reports that she already uses splints. Pt verbalized understanding of results. Pt had no questions at this time but was encouraged to call back if questions arise.

## 2018-10-16 ENCOUNTER — Other Ambulatory Visit: Payer: PRIVATE HEALTH INSURANCE | Admitting: Vascular Surgery

## 2018-10-23 ENCOUNTER — Ambulatory Visit: Payer: PRIVATE HEALTH INSURANCE | Admitting: Vascular Surgery

## 2018-10-23 ENCOUNTER — Encounter (HOSPITAL_COMMUNITY): Payer: PRIVATE HEALTH INSURANCE

## 2018-11-05 ENCOUNTER — Encounter: Payer: Self-pay | Admitting: Plastic Surgery

## 2018-11-05 ENCOUNTER — Ambulatory Visit: Payer: PRIVATE HEALTH INSURANCE | Admitting: Plastic Surgery

## 2018-11-05 DIAGNOSIS — L723 Sebaceous cyst: Secondary | ICD-10-CM

## 2018-11-05 HISTORY — DX: Sebaceous cyst: L72.3

## 2018-11-05 NOTE — H&P (View-Only) (Signed)
Patient ID: Jennifer Werner, female    DOB: Aug 28, 1961, 57 y.o.   MRN: [id number]   Chief Complaint  Patient presents with  . Skin Problem    The patient is a 57 year old white female here for evaluation of a mass on her right medial periorbital area.  The patient states that the mass has been present for several years.  It has been getting larger over the past 6 months.  The skin over the lesion has been getting thinner.  It is on the medial aspect of the upper lid brow junction.  It is 2 cm in size and is firm.  It does not look red or infected.  There is no ulceration.  The skin is intact.  No history of trauma that the patient could remember to the area.  Nothing makes it better and it seems to be getting larger with time.   Review of Systems  Constitutional: Negative.  Negative for activity change.  HENT: Negative.  Negative for facial swelling.   Eyes: Negative.   Respiratory: Negative.   Cardiovascular: Negative.   Gastrointestinal: Negative.   Genitourinary: Negative.   Musculoskeletal: Negative.   Skin: Negative.  Negative for color change and wound.  Hematological: Negative.     Past Medical History:  Diagnosis Date  . Allergy   . Anemia   . Arthritis   . Cancer ([place])   . COPD (chronic obstructive pulmonary disease) ([place])   . Eczema   . GERD (gastroesophageal reflux disease)   . Right carpal tunnel syndrome 10/14/2018  . Sleep apnea   . Smoker     Past Surgical History:  Procedure Laterality Date  . ABLATION     Uterus  . ENDOVENOUS ABLATION SAPHENOUS VEIN W/ LASER Left 09/25/2018   endovenous laser ablation left greater saphenous vein by Ruta Hinds MD       Current Outpatient Medications:  .  albuterol (PROAIR HFA) 108 (90 Base) MCG/ACT inhaler, 2 puffs every 4 hours as needed only  if your can't catch your breath, Disp: 1 Inhaler, Rfl: 1 .  budesonide-formoterol (SYMBICORT) 160-4.5 MCG/ACT inhaler, Take 2 puffs first thing in am and then another 2  puffs about 12 hours later., Disp: 1 Inhaler, Rfl: 11 .  furosemide (LASIX) 40 MG tablet, Take 40 mg by mouth daily., Disp: , Rfl:  .  ibuprofen (ADVIL,MOTRIN) 200 MG tablet, Take 400 mg by mouth as needed., Disp: , Rfl:  .  Multiple Vitamin (MULTIVITAMIN) tablet, Take 1 tablet by mouth daily., Disp: , Rfl:  .  omeprazole (PRILOSEC) 20 MG capsule, Take 20 mg by mouth daily., Disp: , Rfl:  .  OVER THE COUNTER MEDICATION, Vitamin C 1000 mg one tablet daily., Disp: , Rfl:  .  OVER THE COUNTER MEDICATION, Pro- biotic one capsule daily., Disp: , Rfl:  .  potassium chloride (K-DUR) 10 MEQ tablet, Take 1 tablet (10 mEq total) by mouth daily., Disp: 90 tablet, Rfl: 3 .  Tiotropium Bromide Monohydrate (SPIRIVA RESPIMAT) 2.5 MCG/ACT AERS, Inhale 2 puffs into the lungs daily., Disp: 3 Inhaler, Rfl: 3   Objective:   Vitals:   11/05/18 1410  BP: 138/82  Pulse: 82  Resp: 20  SpO2: 93%    Physical Exam  Constitutional: She is oriented to person, place, and time. She appears well-developed and well-nourished.  HENT:  Head: Normocephalic and atraumatic.  Eyes: Pupils are equal, round, and reactive to light.    Cardiovascular: Normal rate.  Pulmonary/Chest: Effort  normal.  Neurological: She is alert and oriented to person, place, and time.  Skin: Skin is warm.  Psychiatric: She has a normal mood and affect. Her behavior is normal. Judgment and thought content normal.    Assessment & Plan:  Sebaceous cyst  Recommend excision of the right medial periorbital sebaceous cyst.  The location I think the patient would feel more comfortable if this was done in the operating room.  She would like to avoid Versed if possible.  [place], DO

## 2018-11-05 NOTE — Progress Notes (Signed)
Patient ID: Jennifer Werner, female    DOB: 01/10/1961, 57 y.o.   MRN: [id number]   Chief Complaint  Patient presents with  . Skin Problem    The patient is a 57 year old white female here for evaluation of a mass on her right medial periorbital area.  The patient states that the mass has been present for several years.  It has been getting larger over the past 6 months.  The skin over the lesion has been getting thinner.  It is on the medial aspect of the upper lid brow junction.  It is 2 cm in size and is firm.  It does not look red or infected.  There is no ulceration.  The skin is intact.  No history of trauma that the patient could remember to the area.  Nothing makes it better and it seems to be getting larger with time.   Review of Systems  Constitutional: Negative.  Negative for activity change.  HENT: Negative.  Negative for facial swelling.   Eyes: Negative.   Respiratory: Negative.   Cardiovascular: Negative.   Gastrointestinal: Negative.   Genitourinary: Negative.   Musculoskeletal: Negative.   Skin: Negative.  Negative for color change and wound.  Hematological: Negative.     Past Medical History:  Diagnosis Date  . Allergy   . Anemia   . Arthritis   . Cancer ([place])   . COPD (chronic obstructive pulmonary disease) ([place])   . Eczema   . GERD (gastroesophageal reflux disease)   . Right carpal tunnel syndrome 10/14/2018  . Sleep apnea   . Smoker     Past Surgical History:  Procedure Laterality Date  . ABLATION     Uterus  . ENDOVENOUS ABLATION SAPHENOUS VEIN W/ LASER Left 09/25/2018   endovenous laser ablation left greater saphenous vein by Ruta Hinds MD       Current Outpatient Medications:  .  albuterol (PROAIR HFA) 108 (90 Base) MCG/ACT inhaler, 2 puffs every 4 hours as needed only  if your can't catch your breath, Disp: 1 Inhaler, Rfl: 1 .  budesonide-formoterol (SYMBICORT) 160-4.5 MCG/ACT inhaler, Take 2 puffs first thing in am and then another 2  puffs about 12 hours later., Disp: 1 Inhaler, Rfl: 11 .  furosemide (LASIX) 40 MG tablet, Take 40 mg by mouth daily., Disp: , Rfl:  .  ibuprofen (ADVIL,MOTRIN) 200 MG tablet, Take 400 mg by mouth as needed., Disp: , Rfl:  .  Multiple Vitamin (MULTIVITAMIN) tablet, Take 1 tablet by mouth daily., Disp: , Rfl:  .  omeprazole (PRILOSEC) 20 MG capsule, Take 20 mg by mouth daily., Disp: , Rfl:  .  OVER THE COUNTER MEDICATION, Vitamin C 1000 mg one tablet daily., Disp: , Rfl:  .  OVER THE COUNTER MEDICATION, Pro- biotic one capsule daily., Disp: , Rfl:  .  potassium chloride (K-DUR) 10 MEQ tablet, Take 1 tablet (10 mEq total) by mouth daily., Disp: 90 tablet, Rfl: 3 .  Tiotropium Bromide Monohydrate (SPIRIVA RESPIMAT) 2.5 MCG/ACT AERS, Inhale 2 puffs into the lungs daily., Disp: 3 Inhaler, Rfl: 3   Objective:   Vitals:   11/05/18 1410  BP: 138/82  Pulse: 82  Resp: 20  SpO2: 93%    Physical Exam  Constitutional: She is oriented to person, place, and time. She appears well-developed and well-nourished.  HENT:  Head: Normocephalic and atraumatic.  Eyes: Pupils are equal, round, and reactive to light.    Cardiovascular: Normal rate.  Pulmonary/Chest: Effort  normal.  Neurological: She is alert and oriented to person, place, and time.  Skin: Skin is warm.  Psychiatric: She has a normal mood and affect. Her behavior is normal. Judgment and thought content normal.    Assessment & Plan:  Sebaceous cyst  Recommend excision of the right medial periorbital sebaceous cyst.  The location I think the patient would feel more comfortable if this was done in the operating room.  She would like to avoid Versed if possible.  [place], DO

## 2018-11-19 ENCOUNTER — Telehealth: Payer: Self-pay | Admitting: Neurology

## 2018-11-19 NOTE — Telephone Encounter (Signed)
Pt requesting a call stating o2 over night test that had been sent to [facility] was never received, pt wanting to know if she can have this done elsewhere.

## 2018-11-19 NOTE — Telephone Encounter (Signed)
I have reached out to [facility] to find out what is going on. 

## 2018-11-19 NOTE — Telephone Encounter (Signed)
I called pt to discuss, no answer, left a message asking her to call me back.  It might be easier for pt for me to reach out to [facility] to find out what is going on than have to switch to a whole new DME.

## 2018-11-19 NOTE — Telephone Encounter (Signed)
Pt returning Rns call stating it would be ok for her to try to get in contact with [facility]

## 2018-11-20 NOTE — Telephone Encounter (Signed)
Received this notice from [facility]:   "Just wanted to let you guys know I spoke with this patient. She requested the driver to bring her the ONO Friday 12/20 so she would be able to sleep longer and better with it being the weekend."

## 2018-11-22 ENCOUNTER — Ambulatory Visit: Payer: PRIVATE HEALTH INSURANCE | Admitting: Internal Medicine

## 2018-11-25 ENCOUNTER — Encounter (HOSPITAL_BASED_OUTPATIENT_CLINIC_OR_DEPARTMENT_OTHER): Payer: Self-pay | Admitting: *Deleted

## 2018-11-25 ENCOUNTER — Other Ambulatory Visit: Payer: Self-pay

## 2018-11-28 ENCOUNTER — Encounter (HOSPITAL_BASED_OUTPATIENT_CLINIC_OR_DEPARTMENT_OTHER)
Admission: RE | Admit: 2018-11-28 | Discharge: 2018-11-28 | Disposition: A | Discharge status: Home / Self Care | Payer: PRIVATE HEALTH INSURANCE | Source: Ambulatory Visit | Attending: Plastic Surgery | Admitting: Plastic Surgery

## 2018-11-28 DIAGNOSIS — Z01812 Encounter for preprocedural laboratory examination: Secondary | ICD-10-CM | POA: Diagnosis not present

## 2018-11-28 LAB — BASIC METABOLIC PANEL
ANION GAP: 10 (ref 5–15)
BUN: 10 mg/dL (ref 6–20)
CALCIUM: 9.8 mg/dL (ref 8.9–10.3)
CO2: 29 mmol/L (ref 22–32)
Chloride: 102 mmol/L (ref 98–111)
Creatinine, Ser: 0.95 mg/dL (ref 0.44–1.00)
GFR calc non Af Amer: >60 mL/min (ref >60)
GLUCOSE: 121 mg/dL — AB (ref 70–99)
Potassium: 3.8 mmol/L (ref 3.5–5.1)
Sodium: 141 mmol/L (ref 135–145)

## 2018-12-01 NOTE — Anesthesia Preprocedure Evaluation (Addendum)
Anesthesia Evaluation  Patient identified by MRN, date of birth, ID band Patient awake    Reviewed: Allergy & Precautions, NPO status , Patient's Chart, lab work & pertinent test results  History of Anesthesia Complications Negative for: history of anesthetic complications  Airway Mallampati: II  TM Distance: >3 FB Neck ROM: Full    Dental  (+) Edentulous Upper, Partial Lower, Dental Advisory Given   Pulmonary sleep apnea and Continuous Positive Airway Pressure Ventilation , COPD,  COPD inhaler, former smoker (quit 6/19),    breath sounds clear to auscultation       Cardiovascular (-) anginanegative cardio ROS   Rhythm:Regular Rate:Normal  5/19 ECHO:  EF 60-65%, valves OK   Neuro/Psych negative neurological ROS     GI/Hepatic Neg liver ROS, GERD  Medicated and Poorly Controlled,  Endo/Other  Morbid obesity  Renal/GU negative Renal ROS     Musculoskeletal  (+) Arthritis ,   Abdominal (+) + obese,   Peds  Hematology negative hematology ROS (+)   Anesthesia Other Findings   Reproductive/Obstetrics                            Anesthesia Physical Anesthesia Plan  ASA: III  Anesthesia Plan: General   Post-op Pain Management:    Induction: Intravenous  PONV Risk Score and Plan: 3 and Ondansetron, Dexamethasone and Scopolamine patch - Pre-op  Airway Management Planned: Oral ETT  Additional Equipment:   Intra-op Plan:   Post-operative Plan: Extubation in OR  Informed Consent: I have reviewed the patients History and Physical, chart, labs and discussed the procedure including the risks, benefits and alternatives for the proposed anesthesia with the patient or authorized representative who has indicated his/her understanding and acceptance.   Dental advisory given  Plan Discussed with: CRNA and Surgeon  Anesthesia Plan Comments: (Plan routine monitors, GETA)       Anesthesia  Quick Evaluation

## 2018-12-02 ENCOUNTER — Encounter (HOSPITAL_BASED_OUTPATIENT_CLINIC_OR_DEPARTMENT_OTHER)
Admission: RE | Disposition: A | Discharge status: Home / Self Care | Payer: Self-pay | Source: Home / Self Care | Attending: Plastic Surgery

## 2018-12-02 ENCOUNTER — Ambulatory Visit (HOSPITAL_BASED_OUTPATIENT_CLINIC_OR_DEPARTMENT_OTHER): Payer: PRIVATE HEALTH INSURANCE | Admitting: Anesthesiology

## 2018-12-02 ENCOUNTER — Other Ambulatory Visit: Payer: Self-pay

## 2018-12-02 ENCOUNTER — Ambulatory Visit (HOSPITAL_BASED_OUTPATIENT_CLINIC_OR_DEPARTMENT_OTHER)
Admission: RE | Admit: 2018-12-02 | Discharge: 2018-12-02 | Disposition: A | Discharge status: Home / Self Care | Payer: PRIVATE HEALTH INSURANCE | Attending: Plastic Surgery | Admitting: Plastic Surgery

## 2018-12-02 ENCOUNTER — Encounter (HOSPITAL_BASED_OUTPATIENT_CLINIC_OR_DEPARTMENT_OTHER): Payer: Self-pay | Admitting: Anesthesiology

## 2018-12-02 DIAGNOSIS — Z6838 Body mass index (BMI) 38.0-38.9, adult: Secondary | ICD-10-CM | POA: Insufficient documentation

## 2018-12-02 DIAGNOSIS — G4730 Sleep apnea, unspecified: Secondary | ICD-10-CM | POA: Insufficient documentation

## 2018-12-02 DIAGNOSIS — Z9989 Dependence on other enabling machines and devices: Secondary | ICD-10-CM | POA: Diagnosis not present

## 2018-12-02 DIAGNOSIS — Z87891 Personal history of nicotine dependence: Secondary | ICD-10-CM | POA: Insufficient documentation

## 2018-12-02 DIAGNOSIS — Z79899 Other long term (current) drug therapy: Secondary | ICD-10-CM | POA: Insufficient documentation

## 2018-12-02 DIAGNOSIS — L720 Epidermal cyst: Secondary | ICD-10-CM | POA: Diagnosis not present

## 2018-12-02 DIAGNOSIS — Z7951 Long term (current) use of inhaled steroids: Secondary | ICD-10-CM | POA: Insufficient documentation

## 2018-12-02 DIAGNOSIS — J449 Chronic obstructive pulmonary disease, unspecified: Secondary | ICD-10-CM | POA: Insufficient documentation

## 2018-12-02 DIAGNOSIS — R220 Localized swelling, mass and lump, head: Secondary | ICD-10-CM | POA: Diagnosis present

## 2018-12-02 DIAGNOSIS — L723 Sebaceous cyst: Secondary | ICD-10-CM

## 2018-12-02 DIAGNOSIS — K219 Gastro-esophageal reflux disease without esophagitis: Secondary | ICD-10-CM | POA: Diagnosis not present

## 2018-12-02 HISTORY — PX: EXCISION MASS HEAD: SHX6702

## 2018-12-02 SURGERY — EXCISION, MASS, HEAD
Anesthesia: General | Laterality: Right

## 2018-12-02 MED ORDER — SODIUM CHLORIDE 0.9% FLUSH: 3.0000 mL | INTRAVENOUS | Status: DC | PRN

## 2018-12-02 MED ORDER — ONDANSETRON HCL 4 MG/2ML IJ SOLN
INTRAMUSCULAR | Status: AC
  Filled 2018-12-02: qty 2

## 2018-12-02 MED ORDER — SUCCINYLCHOLINE CHLORIDE 20 MG/ML IJ SOLN
INTRAMUSCULAR | Status: DC | PRN
  Administered 2018-12-02: 120 mg via INTRAVENOUS

## 2018-12-02 MED ORDER — DEXAMETHASONE SODIUM PHOSPHATE 10 MG/ML IJ SOLN
INTRAMUSCULAR | Status: AC
  Filled 2018-12-02: qty 1

## 2018-12-02 MED ORDER — MIDAZOLAM HCL 2 MG/2ML IJ SOLN
1.0000 mg | INTRAMUSCULAR | Status: DC | PRN
  Administered 2018-12-02: 2 mg via INTRAVENOUS

## 2018-12-02 MED ORDER — OXYCODONE HCL 5 MG/5ML PO SOLN: 5.0000 mg | Freq: Once | ORAL | Status: DC | PRN

## 2018-12-02 MED ORDER — PROPOFOL 10 MG/ML IV BOLUS
INTRAVENOUS | Status: DC | PRN
  Administered 2018-12-02: 200 mg via INTRAVENOUS

## 2018-12-02 MED ORDER — FENTANYL CITRATE (PF) 100 MCG/2ML IJ SOLN: 25.0000 ug | INTRAMUSCULAR | Status: DC | PRN

## 2018-12-02 MED ORDER — ACETAMINOPHEN 650 MG RE SUPP: 650.0000 mg | RECTAL | Status: DC | PRN

## 2018-12-02 MED ORDER — CEFAZOLIN SODIUM-DEXTROSE 2-4 GM/100ML-% IV SOLN
INTRAVENOUS | Status: AC
  Filled 2018-12-02: qty 100

## 2018-12-02 MED ORDER — SODIUM CHLORIDE 0.9% FLUSH: 3.0000 mL | Freq: Two times a day (BID) | INTRAVENOUS | Status: DC

## 2018-12-02 MED ORDER — MIDAZOLAM HCL 2 MG/2ML IJ SOLN: 0.5000 mg | Freq: Once | INTRAMUSCULAR | Status: DC | PRN

## 2018-12-02 MED ORDER — SODIUM CHLORIDE 0.9 % IV SOLN: 250.0000 mL | INTRAVENOUS | Status: DC | PRN

## 2018-12-02 MED ORDER — DEXAMETHASONE SODIUM PHOSPHATE 4 MG/ML IJ SOLN
INTRAMUSCULAR | Status: DC | PRN
  Administered 2018-12-02: 10 mg via INTRAVENOUS

## 2018-12-02 MED ORDER — LIDOCAINE HCL (CARDIAC) PF 100 MG/5ML IV SOSY
PREFILLED_SYRINGE | INTRAVENOUS | Status: DC | PRN
  Administered 2018-12-02: 40 mg via INTRAVENOUS

## 2018-12-02 MED ORDER — LIDOCAINE-EPINEPHRINE 1 %-1:100000 IJ SOLN
INTRAMUSCULAR | Status: DC | PRN
  Administered 2018-12-02: .3 mL

## 2018-12-02 MED ORDER — PROMETHAZINE HCL 25 MG/ML IJ SOLN: 6.2500 mg | INTRAMUSCULAR | Status: DC | PRN

## 2018-12-02 MED ORDER — MIDAZOLAM HCL 2 MG/2ML IJ SOLN
INTRAMUSCULAR | Status: AC
  Filled 2018-12-02: qty 2

## 2018-12-02 MED ORDER — SCOPOLAMINE 1 MG/3DAYS TD PT72: 1.0000 | MEDICATED_PATCH | Freq: Once | TRANSDERMAL | Status: DC | PRN

## 2018-12-02 MED ORDER — FENTANYL CITRATE (PF) 100 MCG/2ML IJ SOLN
50.0000 ug | INTRAMUSCULAR | Status: DC | PRN
  Administered 2018-12-02: 50 ug via INTRAVENOUS

## 2018-12-02 MED ORDER — LACTATED RINGERS IV SOLN
INTRAVENOUS | Status: DC
  Administered 2018-12-02: 08:00:00 via INTRAVENOUS

## 2018-12-02 MED ORDER — ACETAMINOPHEN 325 MG PO TABS: 650.0000 mg | ORAL_TABLET | ORAL | Status: DC | PRN

## 2018-12-02 MED ORDER — ONDANSETRON HCL 4 MG/2ML IJ SOLN
INTRAMUSCULAR | Status: DC | PRN
  Administered 2018-12-02: 4 mg via INTRAVENOUS

## 2018-12-02 MED ORDER — FENTANYL CITRATE (PF) 100 MCG/2ML IJ SOLN
INTRAMUSCULAR | Status: AC
  Filled 2018-12-02: qty 2

## 2018-12-02 MED ORDER — OXYCODONE HCL 5 MG PO TABS: 5.0000 mg | ORAL_TABLET | Freq: Once | ORAL | Status: DC | PRN

## 2018-12-02 MED ORDER — CEFAZOLIN SODIUM-DEXTROSE 2-4 GM/100ML-% IV SOLN
2.0000 g | INTRAVENOUS | Status: AC
  Administered 2018-12-02: 2 g via INTRAVENOUS

## 2018-12-02 MED ORDER — SCOPOLAMINE 1 MG/3DAYS TD PT72: 1.0000 | MEDICATED_PATCH | Freq: Once | TRANSDERMAL | Status: DC

## 2018-12-02 MED ORDER — MEPERIDINE HCL 25 MG/ML IJ SOLN: 6.2500 mg | INTRAMUSCULAR | Status: DC | PRN

## 2018-12-02 SURGICAL SUPPLY — 62 items
BENZOIN TINCTURE PRP APPL 2/3 (GAUZE/BANDAGES/DRESSINGS) IMPLANT
BLADE CLIPPER SURG (BLADE) IMPLANT
BLADE SURG 15 STRL LF DISP TIS (BLADE) ×1 IMPLANT
BLADE SURG 15 STRL SS (BLADE) ×1
BNDG CONFORM 2 STRL LF (GAUZE/BANDAGES/DRESSINGS) IMPLANT
BNDG ELASTIC 2X5.8 VLCR STR LF (GAUZE/BANDAGES/DRESSINGS) IMPLANT
CANISTER SUCT 1200ML W/VALVE (MISCELLANEOUS) IMPLANT
CHLORAPREP W/TINT 26ML (MISCELLANEOUS) IMPLANT
CLEANER CAUTERY TIP 5X5 PAD (MISCELLANEOUS) IMPLANT
CORD BIPOLAR FORCEPS 12FT (ELECTRODE) IMPLANT
COVER BACK TABLE 60X90IN (DRAPES) ×2 IMPLANT
COVER MAYO STAND STRL (DRAPES) ×2 IMPLANT
COVER WAND RF STERILE (DRAPES) IMPLANT
DECANTER SPIKE VIAL GLASS SM (MISCELLANEOUS) IMPLANT
DERMABOND ADVANCED (GAUZE/BANDAGES/DRESSINGS) ×1
DERMABOND ADVANCED .7 DNX12 (GAUZE/BANDAGES/DRESSINGS) ×1 IMPLANT
DRAPE LAPAROTOMY 100X72 PEDS (DRAPES) IMPLANT
DRAPE U-SHAPE 76X120 STRL (DRAPES) ×2 IMPLANT
DRSG TEGADERM 2-3/8X2-3/4 SM (GAUZE/BANDAGES/DRESSINGS) IMPLANT
DRSG TEGADERM 4X4.75 (GAUZE/BANDAGES/DRESSINGS) IMPLANT
ELECT COATED BLADE 2.86 ST (ELECTRODE) IMPLANT
ELECT NEEDLE BLADE 2-5/6 (NEEDLE) ×2 IMPLANT
ELECT REM PT RETURN 9FT ADLT (ELECTROSURGICAL) ×2
ELECT REM PT RETURN 9FT PED (ELECTROSURGICAL)
ELECTRODE REM PT RETRN 9FT PED (ELECTROSURGICAL) IMPLANT
ELECTRODE REM PT RTRN 9FT ADLT (ELECTROSURGICAL) ×1 IMPLANT
GAUZE SPONGE 4X4 12PLY STRL LF (GAUZE/BANDAGES/DRESSINGS) IMPLANT
GLOVE BIO SURGEON STRL SZ 6.5 (GLOVE) IMPLANT
GLOVE SURG SS PI 6.5 STRL IVOR (GLOVE) ×8 IMPLANT
GOWN STRL REUS W/ TWL LRG LVL3 (GOWN DISPOSABLE) ×3 IMPLANT
GOWN STRL REUS W/TWL LRG LVL3 (GOWN DISPOSABLE) ×3
NEEDLE HYPO 30GX1 BEV (NEEDLE) ×2 IMPLANT
NEEDLE PRECISIONGLIDE 27X1.5 (NEEDLE) IMPLANT
NS IRRIG 1000ML POUR BTL (IV SOLUTION) ×2 IMPLANT
PACK BASIN DAY SURGERY FS (CUSTOM PROCEDURE TRAY) ×2 IMPLANT
PAD CLEANER CAUTERY TIP 5X5 (MISCELLANEOUS)
PENCIL BUTTON HOLSTER BLD 10FT (ELECTRODE) ×2 IMPLANT
RUBBERBAND STERILE (MISCELLANEOUS) IMPLANT
SHEET MEDIUM DRAPE 40X70 STRL (DRAPES) IMPLANT
SLEEVE SCD COMPRESS KNEE MED (MISCELLANEOUS) IMPLANT
SPONGE GAUZE 2X2 8PLY STRL LF (GAUZE/BANDAGES/DRESSINGS) IMPLANT
STRIP CLOSURE SKIN 1/2X4 (GAUZE/BANDAGES/DRESSINGS) IMPLANT
SUCTION FRAZIER HANDLE 10FR (MISCELLANEOUS)
SUCTION TUBE FRAZIER 10FR DISP (MISCELLANEOUS) IMPLANT
SUT MNCRL 6-0 UNDY P1 1X18 (SUTURE) ×1 IMPLANT
SUT MNCRL AB 3-0 PS2 18 (SUTURE) IMPLANT
SUT MNCRL AB 4-0 PS2 18 (SUTURE) IMPLANT
SUT MON AB 5-0 P3 18 (SUTURE) IMPLANT
SUT MON AB 5-0 PS2 18 (SUTURE) IMPLANT
SUT MONOCRYL 6-0 P1 1X18 (SUTURE) ×1
SUT PROLENE 5 0 P 3 (SUTURE) IMPLANT
SUT PROLENE 5 0 PS 2 (SUTURE) IMPLANT
SUT PROLENE 6 0 P 1 18 (SUTURE) IMPLANT
SUT VIC AB 5-0 P-3 18X BRD (SUTURE) IMPLANT
SUT VIC AB 5-0 P3 18 (SUTURE)
SUT VIC AB 5-0 PS2 18 (SUTURE) IMPLANT
SUT VICRYL 4-0 PS2 18IN ABS (SUTURE) IMPLANT
SYR BULB 3OZ (MISCELLANEOUS) IMPLANT
SYR CONTROL 10ML LL (SYRINGE) ×2 IMPLANT
TOWEL GREEN STERILE FF (TOWEL DISPOSABLE) ×2 IMPLANT
TRAY DSU PREP LF (CUSTOM PROCEDURE TRAY) ×2 IMPLANT
TUBE CONNECTING 20X1/4 (TUBING) ×2 IMPLANT

## 2018-12-02 NOTE — Op Note (Signed)
DATE OF OPERATION: 12/02/2018  LOCATION: [facility]  PREOPERATIVE DIAGNOSIS: right periorbital sebaceous cyst  POSTOPERATIVE DIAGNOSIS: Same  PROCEDURE: Excision of right periorbital sebaceous cyst 1 x 1.5 cm  SURGEON: Claire Sanger Dillingham, DO  ASSISTANT: Ronette Deter, PA  EBL: none  CONDITION: Stable  COMPLICATIONS: None  INDICATION: The patient, Jennifer Werner, is a 57 y.o. female born on 12-26-1960, is here for treatment of a changing and growing right periorbital cyst.   PROCEDURE DETAILS:  The patient was seen prior to surgery and marked.  The IV antibiotics were given. The patient was taken to the operating room and given a general anesthetic. A standard time out was performed and all information was confirmed by those in the room. SCDs were placed.   The face was prepped and draped in the usual sterile fashion.  The area was marked preoperatively and local was injected for intraoperative hemostasis and postoperative pain control.  The #15 blade was used to make an incision over the lesion.  The scissors and bovie were used to release the sac and lesion in total from the surrounding tissue.  The entire lesion was removed and was consistent grossly with a sebaceous cyst. Hemostasis was achieved with electrocautery.  The incision was closed with 6-0 Monocryl.  Derma bond was applied.   The lesion was sent to pathology. The patient was allowed to wake up and taken to recovery room in stable condition at the end of the case. The family was notified at the end of the case.

## 2018-12-02 NOTE — Anesthesia Procedure Notes (Signed)
Procedure Name: Intubation Date/Time: 12/02/2018 8:46 AM Performed by: Maryella Shivers, CRNA Pre-anesthesia Checklist: Patient identified, Emergency Drugs available, Suction available and Patient being monitored Patient Re-evaluated:Patient Re-evaluated prior to induction Oxygen Delivery Method: Circle system utilized Preoxygenation: Pre-oxygenation with 100% oxygen Induction Type: IV induction Ventilation: Mask ventilation without difficulty Laryngoscope Size: Mac and 3 Grade View: Grade I Tube type: Oral Tube size: 7.0 mm Number of attempts: 1 Airway Equipment and Method: Stylet and Oral airway Placement Confirmation: ETT inserted through vocal cords under direct vision,  positive ETCO2 and breath sounds checked- equal and bilateral Secured at: 20 cm Tube secured with: Tape Dental Injury: Teeth and Oropharynx as per pre-operative assessment

## 2018-12-02 NOTE — Interval H&P Note (Signed)
History and Physical Interval Note:  12/02/2018 8:04 AM  Jennifer Werner  has presented today for surgery, with the diagnosis of Sebaceous cyst  The various methods of treatment have been discussed with the patient and family. After consideration of risks, benefits and other options for treatment, the patient has consented to  Procedure(s): EXCISION RIGHT PERIORBITAL SEBACEOUS CYST (Right) as a surgical intervention .  The patient's history has been reviewed, patient examined, no change in status, stable for surgery.  I have reviewed the patient's chart and labs.  Questions were answered to the patient's satisfaction.     [name]

## 2018-12-02 NOTE — Transfer of Care (Signed)
Immediate Anesthesia Transfer of Care Note  Patient: Jennifer Werner  Procedure(s) Performed: EXCISION RIGHT PERIORBITAL SEBACEOUS CYST (Right )  Patient Location: PACU  Anesthesia Type:General  Level of Consciousness: awake, alert  and oriented  Airway & Oxygen Therapy: Patient Spontanous Breathing and Patient connected to face mask oxygen  Post-op Assessment: Report given to RN and Post -op Vital signs reviewed and stable  Post vital signs: Reviewed and stable  Last Vitals:  Vitals Value Taken Time  BP    Temp    Pulse 84 12/02/2018  9:20 AM  Resp    SpO2 100 % 12/02/2018  9:20 AM  Vitals shown include unvalidated device data.  Last Pain:  Vitals:   12/02/18 0801  TempSrc: Oral  PainSc: 0-No pain         Complications: No apparent anesthesia complications

## 2018-12-02 NOTE — Telephone Encounter (Signed)
We await corrected version of ONO, date is wrong on it.

## 2018-12-02 NOTE — Discharge Instructions (Signed)
INSTRUCTIONS FOR AFTER SURGERY ° ° °You are having surgery.  You will likely have some questions about what to expect following your operation.  The following information will help you and your family understand what to expect when you are discharged from the hospital.  Following these guidelines will help ensure a smooth recovery and reduce risks of complications.   °Postoperative instructions include information on: diet, wound care, medications and physical activity. ° °AFTER SURGERY °Expect to go home after the procedure.  In some cases, you may need to spend one night in the hospital for observation. ° °DIET °This surgery does not require a specific diet.  However, I have to mention that the healthier you eat the better your body can start healing. It is important to increasing your protein intake.  This means limiting the foods with sugar and carbohydrates.  Focus on vegetables and some meat.  If you have any liposuction during your procedure be sure to drink water.  If your urine is bright yellow, then it is concentrated, and you need to drink more water.  As a general rule after surgery, you should have 8 ounces of water every hour while awake.  If you find you are persistently nauseated or unable to take in liquids let us know.  NO TOBACCO USE or EXPOSURE.  This will slow your healing process and increase the risk of a wound. ° °WOUND CARE °You can shower the day after surgery if you don't have a drain.  Use fragrance free soap.  Dial, Dove and Ivory are usually mild on the skin. If you have a drain clean with baby wipes until the drain is removed.  If you have steri-strips / tape directly attached to your skin leave them in place. It is OK to get these wet.  No baths, pools or hot tubs for two weeks. °We close your incision to leave the smallest and best-looking scar. No ointment or creams on your incisions until given the go ahead.  Especially not Neosporin (Too many skin reactions with this one).  A few  weeks after surgery you can use Mederma and start massaging the scar. °We ask you to wear your binder or sports bra for the first 6 weeks around the clock, including while sleeping. This provides added comfort and helps reduce the fluid accumulation at the surgery site. ° °ACTIVITY °No heavy lifting until cleared by the doctor.  It is OK to walk and climb stairs. In fact, moving your legs is very important to decrease your risk of a blood clot.  It will also help keep you from getting deconditioned.  Every 1 to 2 hours get up and walk for 5 minutes. This will help with a quicker recovery back to normal.  Let pain be your guide so you don't do too much.  NO, you cannot do the spring cleaning and don't plan on taking care of anyone else.  This is your time for TLC.  °You will be more comfortable if you sleep and rest with your head elevated either with a few pillows under you or in a recliner.  No stomach sleeping for a few months. ° °WORK °Everyone returns to work at different times. As a rough guide, most people take at least 1 - 2 weeks off prior to returning to work. If you need documentation for your job, bring the forms to your postoperative follow up visit. ° °DRIVING °Arrange for someone to bring you home from the hospital.  You   may be able to drive a few days after surgery but not while taking any narcotics or valium. ° °BOWEL MOVEMENTS °Constipation can occur after anesthesia and while taking pain medication.  It is important to stay ahead for your comfort.  We recommend taking Milk of Magnesia (2 tablespoons; twice a day) while taking the pain pills. ° °SEROMA °This is fluid your body tried to put in the surgical site.  This is normal but if it creates tight skinny skin let us know.  It usually decreases in a few weeks. ° °WHEN TO CALL °Call your surgeon's office if any of the following occur: °• Fever 101 degrees F or greater °• Excessive bleeding or fluid from the incision site. °• Pain that increases  over time without aid from the medications °• Redness, warmth, or pus draining from incision sites °• Persistent nausea or inability to take in liquids °• Severe misshapen area that underwent the operation. ° °] ° ° ° ° ° °Post Anesthesia Home Care Instructions ° °Activity: °Get plenty of rest for the remainder of the day. A responsible individual must stay with you for 24 hours following the procedure.  °For the next 24 hours, DO NOT: °-Drive a car °-Operate machinery °-Drink alcoholic beverages °-Take any medication unless instructed by your physician °-Make any legal decisions or sign important papers. ° °Meals: °Start with liquid foods such as gelatin or soup. Progress to regular foods as tolerated. Avoid greasy, spicy, heavy foods. If nausea and/or vomiting occur, drink only clear liquids until the nausea and/or vomiting subsides. Call your physician if vomiting continues. ° °Special Instructions/Symptoms: °Your throat may feel dry or sore from the anesthesia or the breathing tube placed in your throat during surgery. If this causes discomfort, gargle with warm salt water. The discomfort should disappear within 24 hours. ° °If you had a scopolamine patch placed behind your ear for the management of post- operative nausea and/or vomiting: ° °1. The medication in the patch is effective for 72 hours, after which it should be removed.  Wrap patch in a tissue and discard in the trash. Wash hands thoroughly with soap and water. °2. You may remove the patch earlier than 72 hours if you experience unpleasant side effects which may include dry mouth, dizziness or visual disturbances. °3. Avoid touching the patch. Wash your hands with soap and water after contact with the patch. °   ° °

## 2018-12-02 NOTE — Telephone Encounter (Signed)
Received ONO results from [place]. Will give to Dr. Rexene Alberts for review.

## 2018-12-02 NOTE — Anesthesia Postprocedure Evaluation (Signed)
Anesthesia Post Note  Patient: Jennifer Werner  Procedure(s) Performed: EXCISION RIGHT PERIORBITAL SEBACEOUS CYST (Right )     Patient location during evaluation: PACU Anesthesia Type: General Level of consciousness: awake and alert, patient cooperative and oriented Pain management: pain level controlled Vital Signs Assessment: post-procedure vital signs reviewed and stable Respiratory status: spontaneous breathing, nonlabored ventilation, respiratory function stable and patient connected to nasal cannula oxygen Cardiovascular status: blood pressure returned to baseline and stable Postop Assessment: no apparent nausea or vomiting and adequate PO intake Anesthetic complications: no    Last Vitals:  Vitals:   12/02/18 0930 12/02/18 0956  BP:  135/61  Pulse:  76  Resp: 18 16  Temp:  37.2 C  SpO2: 96% 96%    Last Pain:  Vitals:   12/02/18 0956  TempSrc: Oral  PainSc: 0-No pain                 Anika Shore,E. Talyia Allende

## 2018-12-03 ENCOUNTER — Encounter (HOSPITAL_BASED_OUTPATIENT_CLINIC_OR_DEPARTMENT_OTHER): Payer: Self-pay | Admitting: Plastic Surgery

## 2018-12-05 NOTE — Telephone Encounter (Signed)
I called pt and discussed her ONO on cpap results. Pt verified that she was using cpap during her ONO. Pt is seeing Dr. Melvyn Novas tomorrow and will discuss these results further with him. Pt verbalized understanding of results. Pt had no questions at this time but was encouraged to call back if questions arise.

## 2018-12-05 NOTE — Telephone Encounter (Signed)
I reviewed her overnight pulse oximetry test results from 11/25/2018 through 11/26/2018, total test time was 7 hours and 51 minutes while on room air and on CPAP of 14 cm. Average oxygen saturation was only 89, nadir was 81 and time below or at 88% saturation was 2 hours and 2 minutes for the night. I am not sure how accurate these numbers are because during the CPAP titration her O2 numbers were better without supplemental oxygen utilized at the time and O2 nadir was 93% on the final titration pressure of 14 cm. Please verify, that she used her CPAP all night during the ONO. The patient is encouraged to follow-up with her pulmonologist. She indicated during her appointment in November that she had an appointment in December with her lung doctor. Please encourage her to be fully compliant with her CPAP and to continue to work on weight loss. She can follow-up as scheduled for sleep apnea management.

## 2018-12-06 ENCOUNTER — Ambulatory Visit: Payer: PRIVATE HEALTH INSURANCE | Admitting: Internal Medicine

## 2018-12-06 ENCOUNTER — Encounter: Payer: Self-pay | Admitting: Internal Medicine

## 2018-12-06 DIAGNOSIS — J449 Chronic obstructive pulmonary disease, unspecified: Secondary | ICD-10-CM | POA: Diagnosis not present

## 2018-12-06 MED ORDER — GLYCOPYRROLATE-FORMOTEROL 9-4.8 MCG/ACT IN AERO: 2.0000 | INHALATION_SPRAY | Freq: Two times a day (BID) | RESPIRATORY_TRACT | 11 refills | Status: DC

## 2018-12-06 NOTE — Patient Instructions (Addendum)
Plan A = Automatic = Bevespi Take 2 puffs first thing in am and then another 2 puffs about 12 hours later.   Stop symbiocort and spriva   Work on inhaler technique:  relax and gently blow all the way out then take a nice smooth deep breath back in, triggering the inhaler at same time you start breathing in.  Hold for up to 5 seconds if you can. Blow out thru nose. Rinse and gargle with water when done      Plan B = Backup Only use your albuterol inhaler as a rescue medication to be used if you can't catch your breath by resting or doing a relaxed purse lip breathing pattern.  - The less you use it, the better it will work when you need it. - Ok to use the inhaler up to 2 puffs  every 4 hours if you must but call for appointment if use goes up over your usual need - Don't leave home without it !!  (think of it like the spare tire for your car).     If not happy with bevespi we can call in stiolto which is the same device as spiriva   Low-dose CT lung cancer screening is recommended for patients who are 69-51 years of age with a 30+ pack-year history of smoking, and who are currently smoking or quit <=15 years ago.       Please schedule a follow up visit in 6  months but call sooner if needed

## 2018-12-06 NOTE — Progress Notes (Signed)
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 worse in winter with baseline 120 lf then doe x 2017 worse since winter of 2019 rx with advair 500 dose /combivent and prednisone > 50% back to baseline so referred to pulmonary clinic 05/21/2018 by Windell Hummingbird.   Sleep study 05/15/18 c/w AHI 8 but desats > plan cpap titration byDr Rexene Alberts    05/21/2018 1st [facility] office visit/ Jennifer Werner   Chief Complaint  Patient presents with  . Pulmonary Consult    Referred by Windell Hummingbird, PA for eval of hypoxia.  She states she has been having SOB since Winter 2017. She states she presented to her PCP with SOB in April 2019 and had o2 sat of 88%ra. She states she gets SOB walking for approx 5-10 min.  She has occ cough, prod in the am after inhaler with tan sputum.    sleeps horizontal rotated on L side wakes up each am feeling tired / hits snooze button twice /some am cough/ congestion and then uses combivent x one and w/in 15 min better then later on in the am finally gets around to taking the advair 500. Breathing worse in hot weather. 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 Plan A = Automatic = Symbicort 160 Take 2 puffs first thing in am and then another 2 puffs about 12 hours later.  Work on inhaler technique:  relax and gently blow all the way out then take a nice smooth deep breath back in, triggering the inhaler at same time you start breathing in.  Hold for up to 5 seconds if you can. Blow out thru nose. Rinse and gargle with water when done Plan B = Backup Only use your combivent as a rescue  medication    08/22/2018  f/u ov/Jennifer Werner re: copd /ab component  Chief Complaint  Patient presents with  . Follow-up    PFT results, having nightly heartburn annd reflux, SHOB with ambulation  Dyspnea:  MMRC2 = can't walk a nl pace on a flat grade s sob but does fine slow and flat / also some back pain  Cough: gone Sleeping: L side down / 1 pillow / flat with cpap SABA use: avg twice weekly  02: no  rec Stop comibivent and add spiriva 2 pffs each am after your am symbicort  Work on inhaler technique:   Only use your albuterol as a rescue medication       12/06/2018  f/u ov/Jennifer Werner re:  Copd GOLD II off cigs since /05/2018 / maint symb/spiriva  Chief Complaint  Patient presents with  . Follow-up    Breathing is unchanged. She is using her proair inhaler 1-2 x per wk.   Dyspnea:  MMRC2 = can't walk a nl pace on a flat grade s sob but does fine slow and flat [employer] ok Cough: no Sleeping: cpap bed horizontal / one pillow SABA use: rare 02: none    No obvious day to day or daytime  variability or assoc excess/ purulent sputum or mucus plugs or hemoptysis or cp or chest tightness, subjective wheeze or overt sinus or hb symptoms.   Sleeping as above  without nocturnal  or early am exacerbation  of respiratory  c/o's or need for noct saba. Also denies any obvious fluctuation of symptoms with weather or environmental changes or other aggravating or alleviating factors except as outlined above   No unusual exposure hx or h/o childhood pna/ asthma or knowledge of premature birth.  Current Allergies, Complete Past Medical History, Past Surgical History, Family History, and Social History were reviewed in [employer] record.  ROS  The following are not active complaints unless bolded Hoarseness, sore throat, dysphagia, dental problems, itching, sneezing,  nasal congestion or discharge of excess mucus or purulent secretions, ear ache,   fever, chills, sweats, unintended wt  loss or wt gain, classically pleuritic or exertional cp,  orthopnea pnd or arm/hand swelling  or leg swelling if on them all day, presyncope, palpitations, abdominal pain, anorexia, nausea, vomiting, diarrhea  or change in bowel habits or change in bladder habits, change in stools or change in urine, dysuria, hematuria,  rash, arthralgias, visual complaints, headache, numbness, weakness or ataxia or problems with walking or coordination,  change in mood or  memory.        Current Meds  Medication Sig  . albuterol (PROAIR HFA) 108 (90 Base) MCG/ACT inhaler 2 puffs every 4 hours as needed only  if your can't catch your breath  . budesonide-formoterol (SYMBICORT) 160-4.5 MCG/ACT inhaler Take 2 puffs first thing in am and then another 2 puffs about 12 hours later.  . furosemide (LASIX) 40 MG tablet Take 40 mg by mouth daily.  Marland Kitchen gabapentin (NEURONTIN) 100 MG capsule Take 100 mg by mouth 3 (three) times daily.  Marland Kitchen ibuprofen (ADVIL,MOTRIN) 200 MG tablet Take 400 mg by mouth as needed.  . metFORMIN (GLUCOPHAGE) 500 MG tablet Take 500 mg by mouth daily with breakfast.  . Multiple Vitamin (MULTIVITAMIN) tablet Take 1 tablet by mouth daily.  Marland Kitchen omeprazole (PRILOSEC) 20 MG capsule Take 20 mg by mouth daily.  Marland Kitchen OVER THE COUNTER MEDICATION Vitamin C 1000 mg one tablet daily.  Marland Kitchen OVER THE COUNTER MEDICATION Pro- biotic one capsule daily.  . potassium chloride (K-DUR) 10 MEQ tablet Take 1 tablet (10 mEq total) by mouth daily.  . Tiotropium Bromide Monohydrate (SPIRIVA RESPIMAT) 2.5 MCG/ACT AERS Inhale 2 puffs into the lungs daily.                  Objective:   Physical Exam   amb obese wf talking a mile a minute     12/06/2018         233  08/22/2018       223   05/21/18 216 lb (98 kg)  04/30/18 214 lb 1.9 oz (97.1 kg)  04/25/18 215 lb (97.5 kg)    Vital signs reviewed - Note on arrival 02 sats  93% on RA        HEENT: nl dentition / oropharynx. Nl external ear canals without cough reflex -  Mild  bilateral non-specific turbinate edema     NECK :  without JVD/Nodes/TM/ nl carotid upstrokes bilaterally   LUNGS: no acc muscle use,  Mild barrel  contour chest wall with bilateral  Distant bs s audible wheeze and  without cough on insp or exp maneuver and mild  Hyperresonant  to  percussion bilaterally  CV:  RRR  no s3 or murmur or increase in P2, and no edema   ABD: Obese  soft and nontender with pos late insp Hoover's  in the supine position. No bruits or organomegaly appreciated, bowel sounds nl  MS:   Nl gait/  ext warm without deformities, calf tenderness, cyanosis or clubbing No obvious joint restrictions   SKIN: warm and dry without lesions    NEURO:  alert, approp, nl sensorium with  no motor or cerebellar deficits apparent.         Assessment:

## 2018-12-07 ENCOUNTER — Encounter: Payer: Self-pay | Admitting: Internal Medicine

## 2018-12-07 HISTORY — DX: Morbid (severe) obesity due to excess calories: E66.01

## 2018-12-07 NOTE — Assessment & Plan Note (Addendum)
Quit smoking 05/2018  05/21/2018  Walked RA x 3 laps @ 185 ft each stopped due to  End of study, fast pace, no desat but sob at end of study   Spirometry 05/21/2018  FEV1 1.22 (45%)  Ratio 58 p am advair 500     PFT's  08/22/2018  FEV1 1.50 (56 % ) ratio 65  p 20 % improvement from saba p symb 160 x2  prior to study with DLCO  72 % corrects to 90 % for alv volume   - 12/06/2018  After extensive coaching inhaler device,  effectiveness =    75% > try bevespi 2 bid if insurance covers  >>> consider LDSCT see avs for instructions unique to this ov   >>> f/u q 6 m, sooner prn    S/p smoking cessation 05/2018, now Pt is Group B in terms of symptom/risk and laba/lama therefore appropriate rx at this point so reasaonable to try bevespi 2 bid keeping all her inhalers hfa.   She is familiar with respimat device also so if Bevespi not covered ok to change to stiolto   Advised : formulary restrictions will be an ongoing challenge for the forseable future and I would be happy to pick an alternative if the pt will first  provide me a list of them but pt  will need to return here for training for any new device that is required eg dpi vs hfa vs respimat.    In meantime we can always provide samples so the patient never runs out of any needed respiratory medications.

## 2018-12-07 NOTE — Assessment & Plan Note (Signed)
Body mass index is 38.77 kg/m.  -  trending up p quit smoking  Lab Results  Component Value Date   TSH 0.886 04/02/2018     Contributing to gerd risk/ doe/reviewed the need and the process to achieve and maintain neg calorie balance > defer f/u primary care including intermittently monitoring thyroid status      I had an extended discussion with the patient reviewing all relevant studies completed to date and  lasting 15 to 20 minutes of a 25 minute visit    See device teaching which extended face to face time for this visit.  Each maintenance medication was reviewed in detail including emphasizing most importantly the difference between maintenance and prns and under what circumstances the prns are to be triggered using an action plan format that is not reflected in the computer generated alphabetically organized AVS which I have not found useful in most complex patients, especially with respiratory illnesses  Please see AVS for specific instructions unique to this visit that I personally wrote and verbalized to the the pt in detail and then reviewed with pt  by my nurse highlighting any  changes in therapy recommended at today's visit to their plan of care.

## 2018-12-10 ENCOUNTER — Ambulatory Visit (INDEPENDENT_AMBULATORY_CARE_PROVIDER_SITE_OTHER): Payer: PRIVATE HEALTH INSURANCE | Admitting: Plastic Surgery

## 2018-12-10 ENCOUNTER — Encounter: Payer: Self-pay | Admitting: Plastic Surgery

## 2018-12-10 VITALS — BP 137/67 | HR 79 | Temp 98.5°F | Ht 65.0 in | Wt 233.0 lb

## 2018-12-10 DIAGNOSIS — L723 Sebaceous cyst: Secondary | ICD-10-CM

## 2018-12-10 MED ORDER — ERYTHROMYCIN 5 MG/GM OP OINT: 1.0000 "application " | TOPICAL_OINTMENT | Freq: Two times a day (BID) | OPHTHALMIC | 0 refills | Status: AC

## 2018-12-10 NOTE — Progress Notes (Signed)
   Subjective:    Patient ID: Jennifer Werner, female    DOB: [birth year], 58 y.o.   MRN: [id number]  The patient is a 58 year old white female here for follow-up on her right periorbital sebaceous cyst excision.  She noticed some redness over the last 24 hours and a little bit of drainage.  She started using Neosporin as of yesterday.  Other than that she has been doing well.  It is a little bit swollen and a little red.  It appears to be localized.   Review of Systems  Constitutional: Negative.   HENT: Negative.   Respiratory: Negative.   Musculoskeletal: Negative.   Skin: Positive for wound.       Objective:   Physical Exam Vitals signs and nursing note reviewed.  Constitutional:      Appearance: Normal appearance.  HENT:     Head: Normocephalic.  Eyes:     Extraocular Movements: Extraocular movements intact.   Cardiovascular:     Rate and Rhythm: Normal rate.  Neurological:     Mental Status: She is alert.  Psychiatric:        Mood and Affect: Mood normal.        Thought Content: Thought content normal.        Judgment: Judgment normal.       Assessment & Plan:  Sebaceous cyst  Recommend more Neosporin use the area.  I have sent in a new prescription for erythromycin ophthalmic ointment to apply twice daily.  She is to let me know if this does not improve in the next 24 hours.  The sutures were removed

## 2018-12-17 ENCOUNTER — Encounter: Payer: Self-pay | Admitting: Plastic Surgery

## 2018-12-17 ENCOUNTER — Ambulatory Visit: Payer: PRIVATE HEALTH INSURANCE | Admitting: Plastic Surgery

## 2018-12-17 VITALS — BP 132/73 | HR 74 | Temp 98.8°F | Ht 65.0 in | Wt 233.0 lb

## 2018-12-17 DIAGNOSIS — L723 Sebaceous cyst: Secondary | ICD-10-CM

## 2018-12-17 NOTE — Progress Notes (Signed)
   Subjective:    Patient ID: DAI APEL, female    DOB: 15-Mar-1961, 58 y.o.   MRN: [id number]  The patient is a 58 year old female who is here for follow-up on removal of a right medial periorbital sebaceous cyst.  The area is less red and inflamed than it was on her last visit.  Overall she is pleased with her progress.  Her suture is ready for removal.  There is no sign of infection or drainage.     Review of Systems  Constitutional: Negative.   Respiratory: Negative.   Genitourinary: Negative.   Skin: Negative.       Objective:   Physical Exam Vitals signs and nursing note reviewed.  Constitutional:      Appearance: Normal appearance.  HENT:     Head: Normocephalic.  Neurological:     Mental Status: She is alert.  Psychiatric:        Mood and Affect: Mood normal.        Thought Content: Thought content normal.        Assessment & Plan:  Sebaceous cyst Suture removed.  Continue with light massage and I would like to see her back in 2 weeks.

## 2019-01-03 ENCOUNTER — Encounter: Payer: Self-pay | Admitting: Plastic Surgery

## 2019-01-03 ENCOUNTER — Ambulatory Visit: Payer: PRIVATE HEALTH INSURANCE | Admitting: Plastic Surgery

## 2019-01-03 VITALS — BP 143/75 | HR 82 | Temp 98.1°F | Ht 65.0 in | Wt 231.0 lb

## 2019-01-03 DIAGNOSIS — L723 Sebaceous cyst: Secondary | ICD-10-CM

## 2019-01-03 NOTE — Progress Notes (Signed)
Patient ID: Jennifer Werner, female   DOB: 1961-11-14, 58 y.o.   MRN: [id number]  Jennifer Werner is a 58 year old female here for follow-up on her right medial periorbital cyst excision.  It looks better than it did on her last visit.  Is not red.  There is a little bit of a bump there.  I am not sure if it is the start of another cyst or scar tissue.  I have recommended Vaseline and light massage.  If it gets any bigger we want to know and we may need to do another excision.  I explained that sometimes a large cyst can press against a smaller one in the same site.  Then when the bigger one is excised the smaller one starts to grow.

## 2019-04-03 ENCOUNTER — Other Ambulatory Visit: Payer: Self-pay | Admitting: Cardiology

## 2019-05-05 ENCOUNTER — Other Ambulatory Visit: Payer: Self-pay | Admitting: Internal Medicine

## 2019-05-29 IMAGING — DX DG CHEST 2V
2 series · 2 of 2 positions shown · non-contrast
Comparison: None.

CLINICAL DATA: Shortness of breath, low oxygen saturation, smoking
history

EXAM:
CHEST - 2 VIEW

[chest pa]
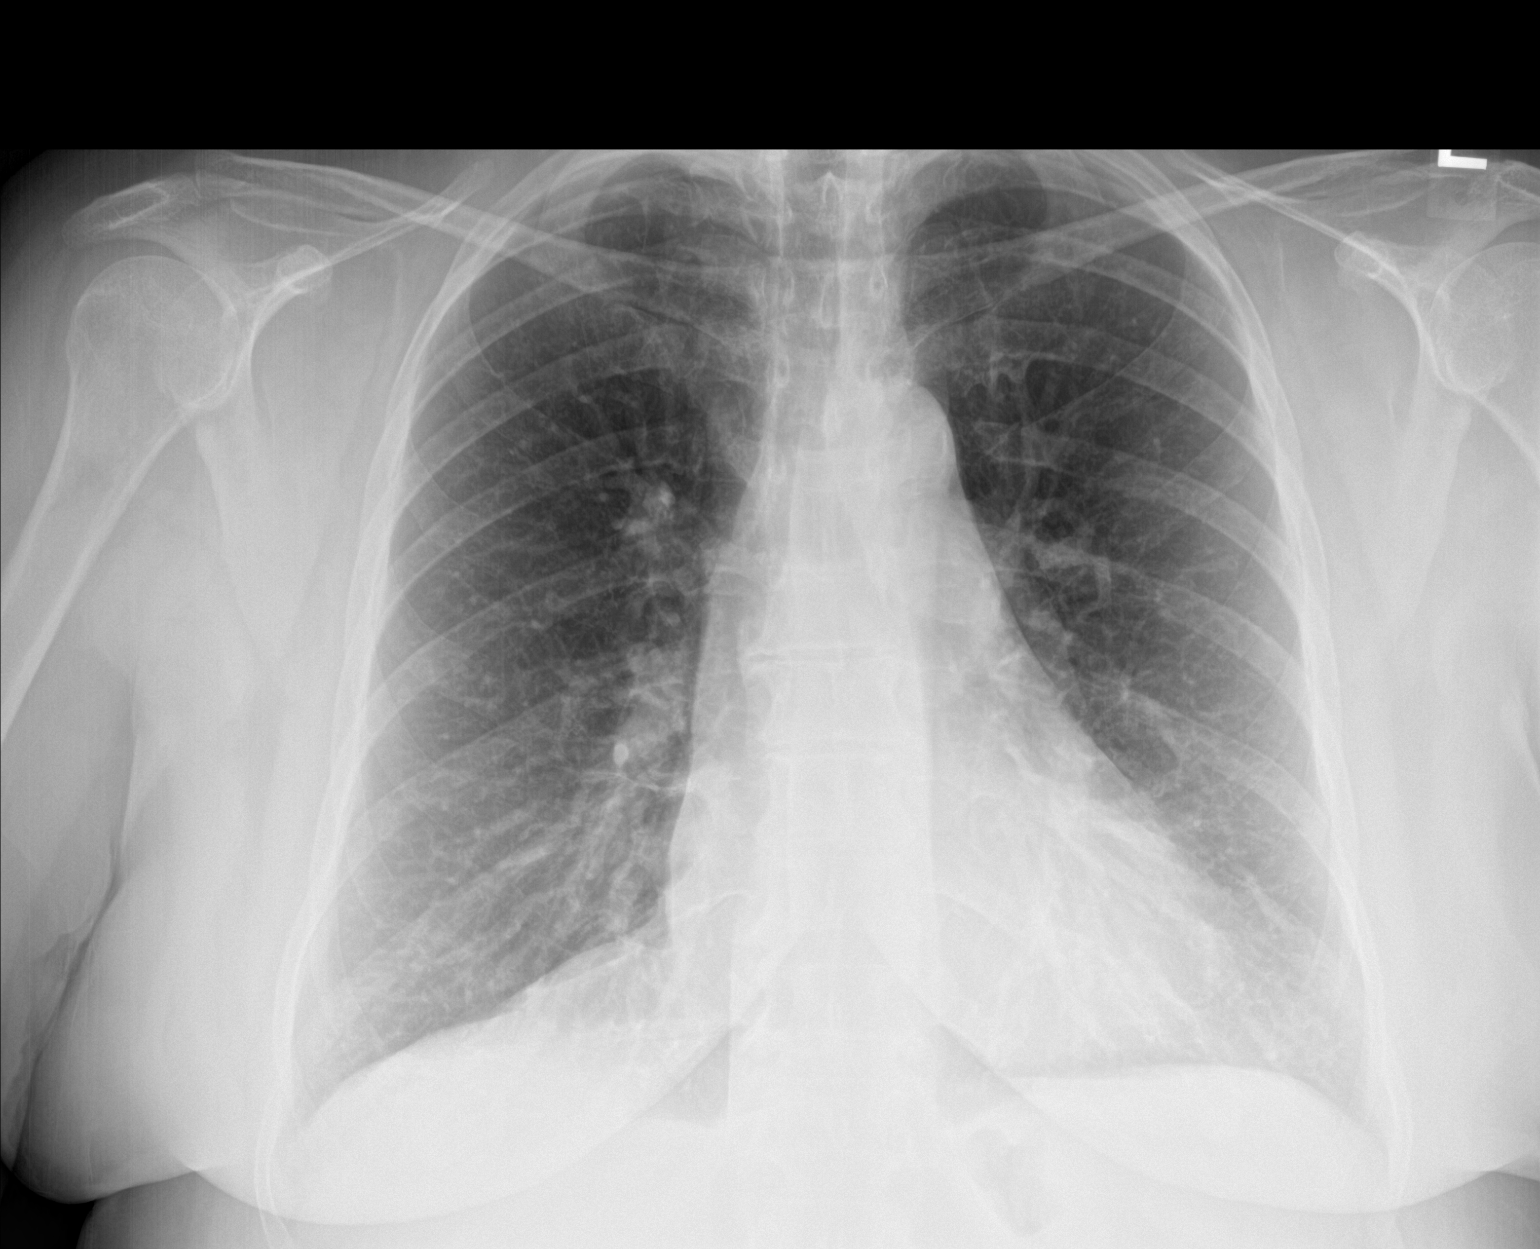

[chest lat]
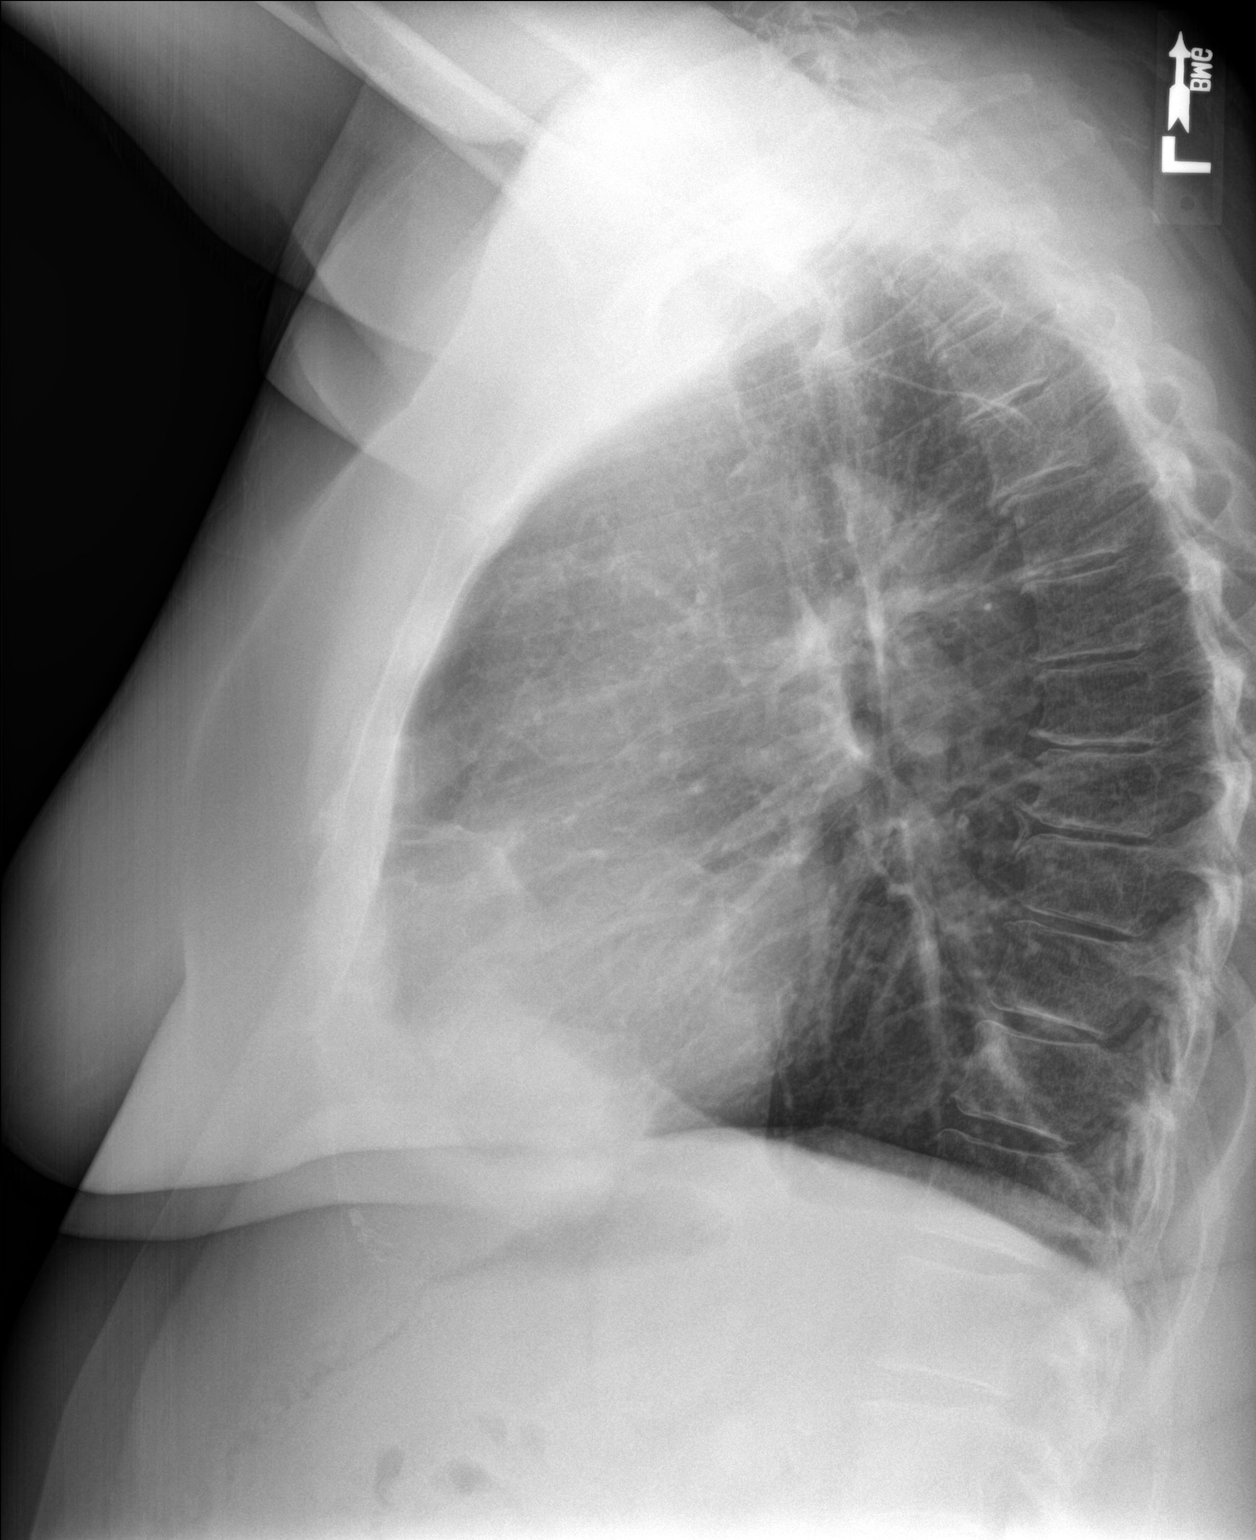

[2 of 2 positions shown; findings below may reference images not displayed]

FINDINGS: No active infiltrate or effusion is seen. Minimal scarring is noted
on the lateral view in the region of the lingula. Mediastinal and
hilar contours are unremarkable. The heart is within upper limits of
normal. No bony abnormality is seen.
IMPRESSION: 1. No active lung disease. Probable linear scarring in the region of
the lingula.
2. Borderline cardiomegaly.

## 2019-05-30 LAB — HM MAMMOGRAPHY

## 2019-06-09 ENCOUNTER — Ambulatory Visit: Payer: PRIVATE HEALTH INSURANCE | Admitting: Internal Medicine

## 2019-06-09 ENCOUNTER — Other Ambulatory Visit: Payer: Self-pay

## 2019-06-09 ENCOUNTER — Encounter: Payer: Self-pay | Admitting: Internal Medicine

## 2019-06-09 DIAGNOSIS — J449 Chronic obstructive pulmonary disease, unspecified: Secondary | ICD-10-CM | POA: Diagnosis not present

## 2019-06-09 NOTE — Progress Notes (Signed)
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 worse in winter with baseline 120 lf then doe x 2017 worse since winter of 2019 rx with advair 500 dose /combivent and prednisone > 50% back to baseline so referred to pulmonary clinic 05/21/2018 by Windell Hummingbird.   Sleep study 05/15/18 c/w AHI 8 but desats > plan cpap titration byDr Rexene Alberts    05/21/2018 1st [facility] office visit/ Jennifer Werner   Chief Complaint  Patient presents with  . Pulmonary Consult    Referred by Windell Hummingbird, PA for eval of hypoxia.  She states she has been having SOB since Winter 2017. She states she presented to her PCP with SOB in April 2019 and had o2 sat of 88%ra. She states she gets SOB walking for approx 5-10 min.  She has occ cough, prod in the am after inhaler with tan sputum.    sleeps horizontal rotated on L side wakes up each am feeling tired / hits snooze button twice /some am cough/ congestion and then uses combivent x one and w/in 15 min better then later on in the am finally gets around to taking the advair 500. Breathing worse in hot weather. 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 Plan A = Automatic = Symbicort 160 Take 2 puffs first thing in am and then another 2 puffs about 12 hours later.  Work on inhaler technique:  relax and gently blow all the way out then take a nice smooth deep breath back in, triggering the inhaler at same time you start breathing in.  Hold for up to 5 seconds if you can. Blow out thru nose. Rinse and gargle with water when done Plan B = Backup Only use your combivent as a rescue  medication    08/22/2018  f/u ov/Jennifer Werner re: copd /ab component  Chief Complaint  Patient presents with  . Follow-up    PFT results, having nightly heartburn annd reflux, SHOB with ambulation  Dyspnea:  MMRC2 = can't walk a nl pace on a flat grade s sob but does fine slow and flat / also some back pain  Cough: gone Sleeping: L side down / 1 pillow / flat with cpap SABA use: avg twice weekly  02: no  rec Stop comibivent and add spiriva 2 pffs each am after your am symbicort  Work on inhaler technique:   Only use your albuterol as a rescue medication       12/06/2018  f/u ov/Jennifer Werner re:  Copd GOLD II off cigs since /05/2018 / maint symb/spiriva  Chief Complaint  Patient presents with  . Follow-up    Breathing is unchanged. She is using her proair inhaler 1-2 x per wk.  Dyspnea:  MMRC2 = can't walk a nl pace on a flat grade s sob but does fine slow and flat [employer] ok Cough: no Sleeping: cpap bed horizontal / one pillow SABA use: rare 02: none  rec Plan A = Automatic = Bevespi Take 2  puffs first thing in am and then another 2 puffs about 12 hours later.   Stop symbiocort and spriva  Work on inhaler technique:  Plan B = Backup Only use your albuterol inhaler as a rescue medication  If not happy with bevespi we can call in stiolto which is the same device as spiriva Low-dose CT lung cancer screening is recommended for patients who are 59-72 years of age with a 30+ pack-year history of smoking, and who are currently smoking or quit <=15 years ago.        06/09/2019  f/u ov/Jennifer Werner re:  GOLD II symb/spiriva  Chief Complaint  Patient presents with  . Follow-up    Breathing is overall doing well and no new co's. She is using her proair once per wk on average.   Dyspnea:  MMRC1 = can walk nl pace, flat grade, can't hurry or go uphills or steps s sob   Cough: none Sleeping: cpap, one pillow bed flat SABA use: rarely  02: none    No obvious day to day or daytime variability or assoc  excess/ purulent sputum or mucus plugs or hemoptysis or cp or chest tightness, subjective wheeze or overt sinus or hb symptoms.   Sleeping as above without nocturnal  or early am exacerbation  of respiratory  c/o's or need for noct saba. Also denies any obvious fluctuation of symptoms with weather or environmental changes or other aggravating or alleviating factors except as outlined above   No unusual exposure hx or h/o childhood pna/ asthma or knowledge of premature birth.  Current Allergies, Complete Past Medical History, Past Surgical History, Family History, and Social History were reviewed in [employer] record.  ROS  The following are not active complaints unless bolded Hoarseness, sore throat, dysphagia, dental problems, itching, sneezing,  nasal congestion or discharge of excess mucus or purulent secretions, ear ache,   fever, chills, sweats, unintended wt loss or wt gain, classically pleuritic or exertional cp,  orthopnea pnd or arm/hand swelling  or leg swelling, presyncope, palpitations, abdominal pain, anorexia, nausea, vomiting, diarrhea  or change in bowel habits or change in bladder habits, change in stools or change in urine, dysuria, hematuria,  rash, arthralgias, visual complaints, headache, numbness, weakness or ataxia or problems with walking or coordination,  change in mood or  memory.        Current Meds  Medication Sig  . albuterol (PROAIR HFA) 108 (90 Base) MCG/ACT inhaler 2 puffs every 4 hours as needed only  if your can't catch your breath  . furosemide (LASIX) 40 MG tablet TAKE 1 TABLET BY MOUTH EVERY DAY  . gabapentin (NEURONTIN) 100 MG capsule Take 100 mg by mouth 3 (three) times daily.  Marland Kitchen ibuprofen (ADVIL,MOTRIN) 200 MG tablet Take 400 mg by mouth as needed.  . metFORMIN (GLUCOPHAGE) 500 MG tablet Take 500 mg by mouth daily with breakfast.  . Multiple Vitamin (MULTIVITAMIN) tablet Take 1 tablet by mouth daily.  Marland Kitchen omeprazole (PRILOSEC) 20 MG  capsule Take 20 mg by mouth daily.  Marland Kitchen OVER THE COUNTER MEDICATION Vitamin C 1000 mg one tablet daily.  Marland Kitchen OVER THE COUNTER MEDICATION Pro- biotic one capsule daily.  . potassium chloride (K-DUR) 10 MEQ tablet TAKE 1 TABLET BY MOUTH EVERY DAY  . SPIRIVA RESPIMAT 2.5 MCG/ACT AERS Inhale 2 puffs into the lungs daily.  . SYMBICORT 160-4.5 MCG/ACT inhaler TAKE 2 PUFFS FIRST THING IN THE MORNING AND THEN ANOTHER 2 PUFFS ABOUT 12 HOURS LATER  Objective:   Physical Exam   amb obese wf nad  06/09/2019         237  12/06/2018         233  08/22/2018       223   05/21/18 216 lb (98 kg)  04/30/18 214 lb 1.9 oz (97.1 kg)  04/25/18 215 lb (97.5 kg)     Vital signs reviewed - Note on arrival 02 sats  96% on RA      . HEENT: full upper denture/ lower partial/ nl oropharynx. Nl external ear canals without cough reflex -  Mild bilateral non-specific turbinate edema     NECK :  without JVD/Nodes/TM/ nl carotid upstrokes bilaterally   LUNGS: no acc muscle use,  Min barrel  contour chest wall with bilateral  slightly decreased bs s audible wheeze and  without cough on insp or exp maneuver and min  Hyperresonant  to  percussion bilaterally     CV:  RRR  no s3 or murmur or increase in P2, and no edema   ABD:  soft and nontender with pos end  insp Hoover's  in the supine position. No bruits or organomegaly appreciated, bowel sounds nl  MS:   Nl gait/  ext warm without deformities, calf tenderness, cyanosis or clubbing No obvious joint restrictions   SKIN: warm and dry without lesions    NEURO:  alert, approp, nl sensorium with  no motor or cerebellar deficits apparent.         Assessment:

## 2019-06-09 NOTE — Patient Instructions (Signed)
No change in medications    Please schedule a follow up visit in 12 months but call sooner if needed (with drug formulary if a change is needed )

## 2019-06-10 ENCOUNTER — Encounter: Payer: Self-pay | Admitting: Internal Medicine

## 2019-06-10 NOTE — Assessment & Plan Note (Signed)
Quit smoking 05/2018  05/21/2018  Walked RA x 3 laps @ 185 ft each stopped due to  End of study, fast pace, no desat but sob at end of study   Spirometry 05/21/2018  FEV1 1.22 (45%)  Ratio 58 p am advair 500 PFT's  08/22/2018  FEV1 1.50 (56 % ) ratio 65  p 20 % improvement from saba p symb 160 x2  prior to study with DLCO  72 % corrects to 90 % for alv volume   - 12/06/2018   try bevespi 2 bid if insurance covers> preferred symb/spiriva  - 06/09/2019  After extensive coaching inhaler device,  effectiveness =    75% (short ti)     Group D in terms of symptom/risk and laba/lama/ICS  therefore appropriate rx at this point >>>  Continue symb/spiriva and try to stay as active as possible   F/u yearly, call sooner if needed   I had an extended discussion with the patient reviewing all relevant studies completed to date and  lasting 15 to 20 minutes of a 25 minute visit    See device teaching which extended face to face time for this visit.  Each maintenance medication was reviewed in detail including emphasizing most importantly the difference between maintenance and prns and under what circumstances the prns are to be triggered using an action plan format that is not reflected in the computer generated alphabetically organized AVS which I have not found useful in most complex patients, especially with respiratory illnesses  Please see AVS for specific instructions unique to this visit that I personally wrote and verbalized to the the pt in detail and then reviewed with pt  by my nurse highlighting any  changes in therapy recommended at today's visit to their plan of care.

## 2019-08-25 DIAGNOSIS — R7401 Elevation of levels of liver transaminase levels: Secondary | ICD-10-CM | POA: Insufficient documentation

## 2019-08-25 DIAGNOSIS — R600 Localized edema: Secondary | ICD-10-CM

## 2019-08-25 DIAGNOSIS — E88819 Insulin resistance, unspecified: Secondary | ICD-10-CM

## 2019-08-25 DIAGNOSIS — E8881 Metabolic syndrome: Secondary | ICD-10-CM

## 2019-08-25 HISTORY — DX: Insulin resistance, unspecified: E88.819

## 2019-08-25 HISTORY — DX: Localized edema: R60.0

## 2019-08-25 HISTORY — DX: Metabolic syndrome: E88.81

## 2019-08-25 HISTORY — DX: Elevation of levels of liver transaminase levels: R74.01

## 2019-09-04 MED ORDER — SPIRIVA RESPIMAT 2.5 MCG/ACT IN AERS: 2.0000 | INHALATION_SPRAY | Freq: Every day | RESPIRATORY_TRACT | 11 refills | Status: DC

## 2019-10-01 ENCOUNTER — Encounter: Payer: Self-pay | Admitting: Gastroenterology

## 2019-10-01 ENCOUNTER — Other Ambulatory Visit: Payer: Self-pay | Admitting: Cardiology

## 2019-10-09 ENCOUNTER — Encounter: Payer: Self-pay | Admitting: Cardiology

## 2019-10-09 ENCOUNTER — Ambulatory Visit: Payer: PRIVATE HEALTH INSURANCE | Admitting: Cardiology

## 2019-10-09 ENCOUNTER — Other Ambulatory Visit: Payer: Self-pay

## 2019-10-09 VITALS — BP 110/70 | HR 71 | Ht 65.0 in | Wt 220.0 lb

## 2019-10-09 DIAGNOSIS — R0600 Dyspnea, unspecified: Secondary | ICD-10-CM | POA: Diagnosis not present

## 2019-10-09 DIAGNOSIS — J449 Chronic obstructive pulmonary disease, unspecified: Secondary | ICD-10-CM | POA: Diagnosis not present

## 2019-10-09 DIAGNOSIS — R0609 Other forms of dyspnea: Secondary | ICD-10-CM

## 2019-10-09 NOTE — Progress Notes (Signed)
Cardiology Office Note:    Date:  10/09/2019   ID:  Jennifer Werner, DOB [birth year], MRN [id number]  PCP:  Jennifer Bale, PA-C  Cardiologist:  Jennifer Campus, MD    Referring MD: Jennifer Bale, PA-C   Chief Complaint  Patient presents with  . Follow-up  Doing well  History of Present Illness:    [name] is a 58 y.o. female who initially was referred to me because of dyspnea on exertion.  Echocardiogram showed preserved ejection fraction.  She does have COPD which is probably responsible for her symptoms.  Overall doing well she join bariatric clinic and she goes down regular basis.  She is working on her weight loss.  Recently she also had cholesterol checked which was elevated but will not outcome this will wait for her to exercise and lose weight to see if we can improve the situation Y  Past Medical History:  Diagnosis Date  . Allergy   . Anemia   . Arthritis   . Cancer ([place])    skin   . COPD (chronic obstructive pulmonary disease) ([place])   . Eczema   . GERD (gastroesophageal reflux disease)   . Right carpal tunnel syndrome 10/14/2018  . Sleep apnea    wears CPAP nightly  . Smoker     Past Surgical History:  Procedure Laterality Date  . ABLATION     Uterus  . ENDOVENOUS ABLATION SAPHENOUS VEIN W/ LASER Left 09/25/2018   endovenous laser ablation left greater saphenous vein by Jennifer Hinds MD   . EXCISION MASS HEAD Right 12/02/2018   Procedure: EXCISION RIGHT PERIORBITAL SEBACEOUS CYST;  Surgeon: Jennifer Going, DO;  Location: [facility];  Service: Plastics;  Laterality: Right;    Current Medications: Current Meds  Medication Sig  . albuterol (PROAIR HFA) 108 (90 Base) MCG/ACT inhaler 2 puffs every 4 hours as needed only  if your can't catch your breath  . furosemide (LASIX) 40 MG tablet TAKE 1 TABLET BY MOUTH EVERY DAY  . gabapentin (NEURONTIN) 100 MG capsule Take 100 mg by mouth 3 (three) times daily.  Marland Kitchen ibuprofen (ADVIL,MOTRIN)  200 MG tablet Take 400 mg by mouth as needed.  . metFORMIN (GLUCOPHAGE) 500 MG tablet Take 500 mg by mouth daily with breakfast.  . Multiple Vitamin (MULTIVITAMIN) tablet Take 1 tablet by mouth daily.  Marland Kitchen OVER THE COUNTER MEDICATION Vitamin C 1000 mg one tablet daily.  Marland Kitchen OVER THE COUNTER MEDICATION Pro- biotic one capsule daily.  . potassium chloride (K-DUR) 10 MEQ tablet TAKE 1 TABLET BY MOUTH EVERY DAY  . SPIRIVA RESPIMAT 2.5 MCG/ACT AERS Inhale 2 puffs into the lungs daily.  . SYMBICORT 160-4.5 MCG/ACT inhaler TAKE 2 PUFFS FIRST THING IN THE MORNING AND THEN ANOTHER 2 PUFFS ABOUT 12 HOURS LATER     Allergies:   Latex and Codeine   Social History   Socioeconomic History  . Marital status: Married    Spouse name: Not on file  . Number of children: Not on file  . Years of education: Not on file  . Highest education level: Not on file  Occupational History  . Not on file  Social Needs  . Financial resource strain: Not on file  . Food insecurity    Worry: Not on file    Inability: Not on file  . Transportation needs    Medical: Not on file    Non-medical: Not on file  Tobacco Use  . Smoking status:  Former Smoker    Packs/day: 2.00    Years: 47.00    Pack years: 94.00    Types: Cigarettes    Quit date: 05/31/2018    Years since quitting: 1.3  . Smokeless tobacco: Never Used  Substance and Sexual Activity  . Alcohol use: Yes    Comment: 5-7 drinks a week  . Drug use: Never  . Sexual activity: Yes    Birth control/protection: Surgical  Lifestyle  . Physical activity    Days per week: Not on file    Minutes per session: Not on file  . Stress: Not on file  Relationships  . Social Herbalist on phone: Not on file    Gets together: Not on file    Attends religious service: Not on file    Active member of club or organization: Not on file    Attends meetings of clubs or organizations: Not on file    Relationship status: Not on file  Other Topics Concern  . Not  on file  Social History Narrative   RN - desk work mainly   Lives with husband     Family History: The patient's family history includes Alcohol abuse in her brother and father; Asthma in her brother; Cardiomyopathy in her brother, father, paternal aunt, and paternal uncle; Congestive Heart Failure in her mother; Heart block in her mother; Lymphoma in her mother; Rheum arthritis in her paternal grandmother. There is no history of Colon cancer, Esophageal cancer, Rectal cancer, or Stomach cancer. ROS:   Please see the history of present illness.    All 14 point review of systems negative except as described per history of present illness  EKGs/Labs/Other Studies Reviewed:      Recent Labs: 11/28/2018: BUN 10; Creatinine, Ser 0.95; Potassium 3.8; Sodium 141  Recent Lipid Panel    Component Value Date/Time   CHOL 166 04/02/2018 1222   TRIG 90 04/02/2018 1222   HDL 46 04/02/2018 1222   CHOLHDL 3.6 04/02/2018 1222   LDLCALC 102 (H) 04/02/2018 1222    Physical Exam:    VS:  BP 110/70   Pulse 71   Ht 5\' 5"  (1.651 m)   Wt 220 lb (99.8 kg)   SpO2 92%   BMI 36.61 kg/m     Wt Readings from Last 3 Encounters:  10/09/19 220 lb (99.8 kg)  06/09/19 237 lb (107.5 kg)  01/03/19 231 lb (104.8 kg)     GEN:  Well nourished, well developed in no acute distress HEENT: Normal NECK: No JVD; No carotid bruits LYMPHATICS: No lymphadenopathy CARDIAC: RRR, no murmurs, no rubs, no gallops RESPIRATORY:  Clear to auscultation without rales, wheezing or rhonchi  ABDOMEN: Soft, non-tender, non-distended MUSCULOSKELETAL:  No edema; No deformity  SKIN: Warm and dry LOWER EXTREMITIES: no swelling NEUROLOGIC:  Alert and oriented x 3 PSYCHIATRIC:  Normal affect   ASSESSMENT:    1. Dyspnea on exertion   2. Morbid obesity complicated by osa/aodm   3. COPD GOLD  II     PLAN:    In order of problems listed above:  1. Dyspnea on exertion doing well from that point review.  Stable. 2. 2.   Morbid obesity in bariatric clinic. 3. COPD follow-up excellently by pulmonary team.   Medication Adjustments/Labs and Tests Ordered: Current medicines are reviewed at length with the patient today.  Concerns regarding medicines are outlined above.  No orders of the defined types were placed in this encounter.  Medication  changes: No orders of the defined types were placed in this encounter.   Signed, Park Liter, MD, [facility] 10/09/2019 10:54 AM    [place]

## 2019-10-09 NOTE — Patient Instructions (Addendum)
Medication Instructions:  No medication changes today.  *If you need a refill on your cardiac medications before your next appointment, please call your pharmacy*  Lab Work: No lab work today.  If you have labs (blood work) drawn today and your tests are completely normal, you will receive your results only by: Marland Kitchen MyChart Message (if you have MyChart) OR . A paper copy in the mail If you have any lab test that is abnormal or we need to change your treatment, we will call you to review the results.  Testing/Procedures: None today.  Follow-Up: At [facility], you and your health needs are our priority.  As part of our continuing mission to provide you with exceptional heart care, we have created designated Provider Care Teams.  These Care Teams include your primary Cardiologist (physician) and Advanced Practice Providers (APPs -  Physician Assistants and Nurse Practitioners) who all work together to provide you with the care you need, when you need it.  Your next appointment:   6 months  The format for your next appointment:   In Person  Provider:   You may see Jenne Campus, MD or the following Advanced Practice Provider on your designated Care Team:    Laurann Montana, FNP   Other Instructions   DASH Eating Plan DASH stands for "Dietary Approaches to Stop Hypertension." The DASH eating plan is a healthy eating plan that has been shown to reduce high blood pressure (hypertension). It may also reduce your risk for type 2 diabetes, heart disease, and stroke. The DASH eating plan may also help with weight loss. What are tips for following this plan?  General guidelines  Avoid eating more than 2,300 mg (milligrams) of salt (sodium) a day. If you have hypertension, you may need to reduce your sodium intake to 1,500 mg a day.  Limit alcohol intake to no more than 1 drink a day for nonpregnant women and 2 drinks a day for men. One drink equals 12 oz of beer, 5 oz of wine, or 1  oz of hard liquor.  Work with your health care provider to maintain a healthy body weight or to lose weight. Ask what an ideal weight is for you.  Get at least 30 minutes of exercise that causes your heart to beat faster (aerobic exercise) most days of the week. Activities may include walking, swimming, or biking.  Work with your health care provider or diet and nutrition specialist (dietitian) to adjust your eating plan to your individual calorie needs. Reading food labels   Check food labels for the amount of sodium per serving. Choose foods with less than 5 percent of the Daily Value of sodium. Generally, foods with less than 300 mg of sodium per serving fit into this eating plan.  To find whole grains, look for the word "whole" as the first word in the ingredient list. Shopping  Buy products labeled as "low-sodium" or "no salt added."  Buy fresh foods. Avoid canned foods and premade or frozen meals. Cooking  Avoid adding salt when cooking. Use salt-free seasonings or herbs instead of table salt or sea salt. Check with your health care provider or pharmacist before using salt substitutes.  Do not fry foods. Cook foods using healthy methods such as baking, boiling, grilling, and broiling instead.  Cook with heart-healthy oils, such as olive, canola, soybean, or sunflower oil. Meal planning  Eat a balanced diet that includes: ? 5 or more servings of fruits and vegetables each day. At each  meal, try to fill half of your plate with fruits and vegetables. ? Up to 6-8 servings of whole grains each day. ? Less than 6 oz of lean meat, poultry, or fish each day. A 3-oz serving of meat is about the same size as a deck of cards. One egg equals 1 oz. ? 2 servings of low-fat dairy each day. ? A serving of nuts, seeds, or beans 5 times each week. ? Heart-healthy fats. Healthy fats called Omega-3 fatty acids are found in foods such as flaxseeds and coldwater fish, like sardines, salmon, and  mackerel.  Limit how much you eat of the following: ? Canned or prepackaged foods. ? Food that is high in trans fat, such as fried foods. ? Food that is high in saturated fat, such as fatty meat. ? Sweets, desserts, sugary drinks, and other foods with added sugar. ? Full-fat dairy products.  Do not salt foods before eating.  Try to eat at least 2 vegetarian meals each week.  Eat more home-cooked food and less restaurant, buffet, and fast food.  When eating at a restaurant, ask that your food be prepared with less salt or no salt, if possible. What foods are recommended? The items listed may not be a complete list. Talk with your dietitian about what dietary choices are best for you. Grains Whole-grain or whole-wheat bread. Whole-grain or whole-wheat pasta. Brown rice. [place]. Bulgur. Whole-grain and low-sodium cereals. Pita bread. Low-fat, low-sodium crackers. Whole-wheat flour tortillas. Vegetables Fresh or frozen vegetables (raw, steamed, roasted, or grilled). Low-sodium or reduced-sodium tomato and vegetable juice. Low-sodium or reduced-sodium tomato sauce and tomato paste. Low-sodium or reduced-sodium canned vegetables. Fruits All fresh, dried, or frozen fruit. Canned fruit in natural juice (without added sugar). Meat and other protein foods Skinless chicken or [place]. Ground chicken or [place]. Pork with fat trimmed off. Fish and seafood. Egg whites. Dried beans, peas, or lentils. Unsalted nuts, nut butters, and seeds. Unsalted canned beans. Lean cuts of beef with fat trimmed off. Low-sodium, lean deli meat. Dairy Low-fat (1%) or fat-free (skim) milk. Fat-free, low-fat, or reduced-fat cheeses. Nonfat, low-sodium ricotta or cottage cheese. Low-fat or nonfat yogurt. Low-fat, low-sodium cheese. Fats and oils Soft margarine without trans fats. Vegetable oil. Low-fat, reduced-fat, or light mayonnaise and salad dressings (reduced-sodium). Canola, safflower, olive, soybean, and  sunflower oils. Avocado. Seasoning and other foods Herbs. Spices. Seasoning mixes without salt. Unsalted popcorn and pretzels. Fat-free sweets. What foods are not recommended? The items listed may not be a complete list. Talk with your dietitian about what dietary choices are best for you. Grains Baked goods made with fat, such as croissants, muffins, or some breads. Dry pasta or rice meal packs. Vegetables Creamed or fried vegetables. Vegetables in a cheese sauce. Regular canned vegetables (not low-sodium or reduced-sodium). Regular canned tomato sauce and paste (not low-sodium or reduced-sodium). Regular tomato and vegetable juice (not low-sodium or reduced-sodium). Angie Fava. Olives. Fruits Canned fruit in a light or heavy syrup. Fried fruit. Fruit in cream or butter sauce. Meat and other protein foods Fatty cuts of meat. Ribs. Fried meat. Berniece Salines. Sausage. Bologna and other processed lunch meats. Salami. Fatback. Hotdogs. Bratwurst. Salted nuts and seeds. Canned beans with added salt. Canned or smoked fish. Whole eggs or egg yolks. Chicken or [place] with skin. Dairy Whole or 2% milk, cream, and half-and-half. Whole or full-fat cream cheese. Whole-fat or sweetened yogurt. Full-fat cheese. Nondairy creamers. Whipped toppings. Processed cheese and cheese spreads. Fats and oils Butter. Stick margarine. Lard. Shortening.  Wyvonnia Lora fat. Tropical oils, such as coconut, palm kernel, or palm oil. Seasoning and other foods Salted popcorn and pretzels. Onion salt, garlic salt, seasoned salt, table salt, and sea salt. Worcestershire sauce. Tartar sauce. Barbecue sauce. Teriyaki sauce. Soy sauce, including reduced-sodium. Steak sauce. Canned and packaged gravies. Fish sauce. Oyster sauce. Cocktail sauce. Horseradish that you find on the shelf. Ketchup. Mustard. Meat flavorings and tenderizers. Bouillon cubes. Hot sauce and Tabasco sauce. Premade or packaged marinades. Premade or packaged taco seasonings.  Relishes. Regular salad dressings. Where to find more information:  National Heart, Lung, and [place]: https://wilson-eaton.com/  American Heart Association: www.heart.org Summary  The DASH eating plan is a healthy eating plan that has been shown to reduce high blood pressure (hypertension). It may also reduce your risk for type 2 diabetes, heart disease, and stroke.  With the DASH eating plan, you should limit salt (sodium) intake to 2,300 mg a day. If you have hypertension, you may need to reduce your sodium intake to 1,500 mg a day.  When on the DASH eating plan, aim to eat more fresh fruits and vegetables, whole grains, lean proteins, low-fat dairy, and heart-healthy fats.  Work with your health care provider or diet and nutrition specialist (dietitian) to adjust your eating plan to your individual calorie needs. This information is not intended to replace advice given to you by your health care provider. Make sure you discuss any questions you have with your health care provider. Document Released: 11/09/2011 Document Revised: 11/02/2017 Document Reviewed: 11/13/2016 Elsevier Patient Education  2020 [employer].

## 2019-10-14 ENCOUNTER — Ambulatory Visit: Payer: PRIVATE HEALTH INSURANCE | Admitting: Adult Health

## 2019-10-14 ENCOUNTER — Encounter: Payer: Self-pay | Admitting: Adult Health

## 2019-10-14 ENCOUNTER — Other Ambulatory Visit: Payer: Self-pay

## 2019-10-14 VITALS — BP 120/66 | HR 63 | Ht 65.0 in | Wt 220.0 lb

## 2019-10-14 DIAGNOSIS — G4733 Obstructive sleep apnea (adult) (pediatric): Secondary | ICD-10-CM | POA: Diagnosis not present

## 2019-10-14 DIAGNOSIS — Z9989 Dependence on other enabling machines and devices: Secondary | ICD-10-CM | POA: Diagnosis not present

## 2019-10-14 NOTE — Patient Instructions (Signed)
Continue using CPAP nightly and greater than 4 hours each night °If your symptoms worsen or you develop new symptoms please let us know.  ° °

## 2019-10-14 NOTE — Progress Notes (Signed)
PATIENT: Jennifer Werner DOB: 11-May-1961  REASON FOR VISIT: follow up HISTORY FROM: patient  HISTORY OF PRESENT ILLNESS: Today 10/14/19:  Jennifer Werner is a 58 year old female with a history of obstructive sleep apnea on CPAP.  Her download indicates that she use her machine nightly for compliance of 100%.  She used her machine greater than 4 hours each night.  On average she uses her machine 8 hours and 55 minutes.  Her residual AHI is 1.1 on 14 cmH2O with EPR of 1.  Her leak in the 95th percentile is 21.8 L/min.  She reports that the CPAP is working well for her.  She returns today for evaluation.  HISTORY (copied from Dr. Guadelupe Werner note) 10/07/2018: I reviewed her CPAP compliance data from 09/03/2018 through 10/02/2018 which is a total of 30 days, during which time she used her CPAP every night with percent used days greater than 4 hours at 100%, indicating superb compliance with an average usage of 7 hours and 54 minutes, residual AHI at goal at 2.1 per hour, leak on the higher side with the 95th percentile at 17.7 L/m on a pressure of 14 cm. She reports feeling better with CPAP. Her shortness of breath has improved, her daytime somnolence has improved. She has a follow-up appointment with pulmonology next month. She is agreeable to doing an overnight pulse oximetry test just for making sure that her oxygen saturations are better on CPAP. She does not use any supplemental oxygen at this point. She has had intermittent numbness and tingling affecting both forearms and this wakes her up at night with a sense of sharp pains radiating from the elbow areas to the last 3 digits of both hands. Sometimes this happens during the day as she uses her computer or when driving. She has a family history of rheumatoid arthritis. She has had some arthritic symptoms in both hands. She currently is taking high-dose ibuprofen because of vascular surgery. She should be coming off of the high-dose ibuprofen, has a  follow-up appointment with vascular surgery this week.unfortunately, she has gained weight since she quit smoking.she is working on weight loss.   REVIEW OF SYSTEMS: Out of a complete 14 system review of symptoms, the patient complains only of the following symptoms, and all other reviewed systems are negative.  See HPI  ALLERGIES: Allergies  Allergen Reactions   Latex Rash   Codeine     HOME MEDICATIONS: Outpatient Medications Prior to Visit  Medication Sig Dispense Refill   metFORMIN (GLUCOPHAGE) 500 MG tablet Take 500 mg by mouth 2 (two) times daily.      albuterol (PROAIR HFA) 108 (90 Base) MCG/ACT inhaler 2 puffs every 4 hours as needed only  if your can't catch your breath 1 Inhaler 1   furosemide (LASIX) 40 MG tablet TAKE 1 TABLET BY MOUTH EVERY DAY 90 tablet 1   gabapentin (NEURONTIN) 100 MG capsule Take 100 mg by mouth 3 (three) times daily.     ibuprofen (ADVIL,MOTRIN) 200 MG tablet Take 400 mg by mouth as needed.     Multiple Vitamin (MULTIVITAMIN) tablet Take 1 tablet by mouth daily.     OVER THE COUNTER MEDICATION Vitamin C 1000 mg one tablet daily.     OVER THE COUNTER MEDICATION Pro- biotic one capsule daily.     potassium chloride (K-DUR) 10 MEQ tablet TAKE 1 TABLET BY MOUTH EVERY DAY 90 tablet 1   SPIRIVA RESPIMAT 2.5 MCG/ACT AERS Inhale 2 puffs into the lungs daily.  4 g 11   SYMBICORT 160-4.5 MCG/ACT inhaler TAKE 2 PUFFS FIRST THING IN THE MORNING AND THEN ANOTHER 2 PUFFS ABOUT 12 HOURS LATER 10.2 Inhaler 6   No facility-administered medications prior to visit.     PAST MEDICAL HISTORY: Past Medical History:  Diagnosis Date   Allergy    Anemia    Arthritis    Cancer ([place])    skin    COPD (chronic obstructive pulmonary disease) ([place])    Eczema    GERD (gastroesophageal reflux disease)    Right carpal tunnel syndrome 10/14/2018   Sleep apnea    wears CPAP nightly   Smoker     PAST SURGICAL HISTORY: Past Surgical History:    Procedure Laterality Date   ABLATION     Uterus   ENDOVENOUS ABLATION SAPHENOUS VEIN W/ LASER Left 09/25/2018   endovenous laser ablation left greater saphenous vein by Jennifer Hinds MD    EXCISION MASS HEAD Right 12/02/2018   Procedure: EXCISION RIGHT PERIORBITAL SEBACEOUS CYST;  Surgeon: Jennifer Going, DO;  Location: [facility];  Service: Plastics;  Laterality: Right;    FAMILY HISTORY: Family History  Problem Relation Age of Onset   Heart block Mother    Lymphoma Mother    Congestive Heart Failure Mother    Cardiomyopathy Father    Alcohol abuse Father    Cardiomyopathy Brother        Had Heart Transplant   Alcohol abuse Brother    Asthma Brother    Cardiomyopathy Paternal Aunt    Cardiomyopathy Paternal Uncle    Rheum arthritis Paternal Grandmother    Colon cancer Neg Hx    Esophageal cancer Neg Hx    Rectal cancer Neg Hx    Stomach cancer Neg Hx     SOCIAL HISTORY: Social History   Socioeconomic History   Marital status: Married    Spouse name: Not on file   Number of children: Not on file   Years of education: Not on file   Highest education level: Not on file  Occupational History   Not on file  Social Needs   Financial resource strain: Not on file   Food insecurity    Worry: Not on file    Inability: Not on file   Transportation needs    Medical: Not on file    Non-medical: Not on file  Tobacco Use   Smoking status: Former Smoker    Packs/day: 2.00    Years: 47.00    Pack years: 94.00    Types: Cigarettes    Quit date: 05/31/2018    Years since quitting: 1.3   Smokeless tobacco: Never Used  Substance and Sexual Activity   Alcohol use: Yes    Comment: 5-7 drinks a week   Drug use: Never   Sexual activity: Yes    Birth control/protection: Surgical  Lifestyle   Physical activity    Days per week: Not on file    Minutes per session: Not on file   Stress: Not on file  Relationships    Social connections    Talks on phone: Not on file    Gets together: Not on file    Attends religious service: Not on file    Active member of club or organization: Not on file    Attends meetings of clubs or organizations: Not on file    Relationship status: Not on file   Intimate partner violence    Fear of current or ex  partner: Not on file    Emotionally abused: Not on file    Physically abused: Not on file    Forced sexual activity: Not on file  Other Topics Concern   Not on file  Social History Narrative   RN - desk work mainly   Lives with husband      PHYSICAL EXAM  Vitals:   10/14/19 1440  BP: 120/66  Pulse: 63  Weight: 220 lb (99.8 kg)  Height: 5\' 5"  (1.651 m)   Body mass index is 36.61 kg/m. Generalized: Well developed, in no acute distress  Chest: Lungs clear to auscultation bilaterally  Neurological examination  Mentation: Alert oriented to time, place, history taking. Follows all commands speech and language fluent Cranial nerve II-XII: Extraocular movements were full, visual field were full on confrontational test Head turning and shoulder shrug  were normal and symmetric. Motor: The motor testing reveals 5 over 5 strength of all 4 extremities. Good symmetric motor tone is noted throughout.  Sensory: Sensory testing is intact to soft touch on all 4 extremities. No evidence of extinction is noted.  Gait and station: Gait is normal.    DIAGNOSTIC DATA (LABS, IMAGING, TESTING) - I reviewed patient records, labs, notes, testing and imaging myself where available.  Lab Results  Component Value Date   WBC 8.3 04/02/2018   HGB 16.6 (H) 04/02/2018   HCT 50.3 (H) 04/02/2018   MCV 88 04/02/2018   PLT 260 04/02/2018      Component Value Date/Time   NA 141 11/28/2018 1226   NA 140 08/26/2018 1210   K 3.8 11/28/2018 1226   CL 102 11/28/2018 1226   CO2 29 11/28/2018 1226   GLUCOSE 121 (H) 11/28/2018 1226   BUN 10 11/28/2018 1226   BUN 15 08/26/2018  1210   CREATININE 0.95 11/28/2018 1226   CREATININE 0.70 06/26/2012 1505   CALCIUM 9.8 11/28/2018 1226   PROT 7.2 06/21/2018 1648   ALBUMIN 4.5 06/21/2018 1648   AST 39 06/21/2018 1648   ALT 77 (H) 06/21/2018 1648   ALKPHOS 74 06/21/2018 1648   BILITOT <0.2 06/21/2018 1648   GFRNONAA >60 11/28/2018 1226   GFRAA >60 11/28/2018 1226   Lab Results  Component Value Date   CHOL 166 04/02/2018   HDL 46 04/02/2018   LDLCALC 102 (H) 04/02/2018   TRIG 90 04/02/2018   CHOLHDL 3.6 04/02/2018   Lab Results  Component Value Date   HGBA1C 5.9 (H) 04/02/2018   No results found for: VITAMINB12 Lab Results  Component Value Date   TSH 0.886 04/02/2018      ASSESSMENT AND PLAN 58 y.o. year old female  has a past medical history of Allergy, Anemia, Arthritis, Cancer ([place]), COPD (chronic obstructive pulmonary disease) ([place]), Eczema, GERD (gastroesophageal reflux disease), Right carpal tunnel syndrome (10/14/2018), Sleep apnea, and Smoker. here with :  1. Obstructive sleep apnea on CPAP  Patient CPAP download shows excellent compliance and good treatment of her apnea.  She is encouraged to continue using CPAP nightly and greater than 4 hours each night.  She is advised that if her symptoms worsen or she develops new symptoms she should let us know.  She will follow-up in 1 year or sooner if needed    I spent 15 minutes with the patient. 50% of this time was spent reviewing CPAP download   Ward Givens, MSN, NP-C 10/14/2019, 2:54 PM [facility] [address] [place], [place] [address] [phone]

## 2019-10-21 ENCOUNTER — Other Ambulatory Visit: Payer: Self-pay | Admitting: Cardiology

## 2019-10-22 ENCOUNTER — Telehealth (INDEPENDENT_AMBULATORY_CARE_PROVIDER_SITE_OTHER): Payer: PRIVATE HEALTH INSURANCE | Admitting: Primary Care

## 2019-10-22 ENCOUNTER — Telehealth: Payer: Self-pay | Admitting: Internal Medicine

## 2019-10-22 DIAGNOSIS — J449 Chronic obstructive pulmonary disease, unspecified: Secondary | ICD-10-CM | POA: Diagnosis not present

## 2019-10-22 MED ORDER — BREO ELLIPTA 200-25 MCG/INH IN AEPB: 1.0000 | INHALATION_SPRAY | Freq: Every day | RESPIRATORY_TRACT | 0 refills | Status: DC

## 2019-10-22 MED ORDER — PREDNISONE 10 MG PO TABS: ORAL_TABLET | ORAL | 0 refills | Status: DC

## 2019-10-22 NOTE — Progress Notes (Signed)
Virtual Visit via Video Note  I connected with Jennifer Werner on 10/22/19 at  3:00 PM EST by a video enabled telemedicine application and verified that I am speaking with the correct person using two identifiers.  Location: Patient: Home Provider: Office   I discussed the limitations of evaluation and management by telemedicine and the availability of in person appointments. The patient expressed understanding and agreed to proceed.  History of Present Illness: 58 year old female, former smoker quit 06/01/18. PMH significant for COPD GOLD II, GERD, chronic venous insufficiency. Patient of Dr. Melvyn Novas, last seen on 06/09/19.   10/22/2019 Patient contacted today for acute visit. States that she misplaced her Symbicort and she can not refill inhaler until 11/29. Office does not currently have samples of Symbicort. She has been a little more short of breath recently. No significant associated URI symptoms.    Observations/Objective:  - No observed shortness of breath, wheezing or cough  PFT's:  Spirometry 05/21/2018  FEV1 1.22 (45%)  Ratio 58 p am advair 500 08/22/2018  FEV1 1.50 (56 % ) ratio 65  p 20 % improvement from saba p symb 160 x2  prior to study with DLCO  72 % corrects to 90 % for alv volume    Assessment and Plan:  COPD GOLD II: Mild exacerbation d/t lost Symbicort inhaler  Sample Breo 200 x2 weeks; then resume Symbicort 160 2 puffs twice daily Continue Spiriva respimat 2.13mcg Prednsione taper to have on hand if shortness of breath does not improve or worsens  Follow Up Instructions:   July 2021 with Dr. Melvyn Novas   I discussed the assessment and treatment plan with the patient. The patient was provided an opportunity to ask questions and all were answered. The patient agreed with the plan and demonstrated an understanding of the instructions.   The patient was advised to call back or seek an in-person evaluation if the symptoms worsen or if the condition fails to improve as  anticipated.  I provided 18 minutes of non-face-to-face time during this encounter.   Martyn Ehrich, NP

## 2019-10-22 NOTE — Telephone Encounter (Signed)
Call returned to patient, confirmed DOB, patient states she has mis-placed her symbicort. She is requesting samples. She called the pharmacy and she cannot get a refill until the 28th. I made her aware we no longer carry symbicort samples but she will need a visit to speak with a provider to see what she can use to hold her over. Appt made.   Nothing further needed at this time.

## 2019-10-22 NOTE — Patient Instructions (Addendum)
Pleasure speaking with you today Jennifer Werner  Recommendations Use sample of BREO for 2 weeks- take 1 puff daily in the morning (rinse mouth after use); then resume Symbicort  Prednisone taper to have on hand if shortness of breath does not improve or worsens  Follow-up As needed; Annual visit with Jennifer Werner due July 2021

## 2019-10-23 ENCOUNTER — Encounter: Payer: Self-pay | Admitting: Primary Care

## 2019-10-27 ENCOUNTER — Other Ambulatory Visit: Payer: Self-pay

## 2019-10-27 ENCOUNTER — Ambulatory Visit (AMBULATORY_SURGERY_CENTER): Payer: PRIVATE HEALTH INSURANCE | Admitting: *Deleted

## 2019-10-27 VITALS — Temp 96.6°F | Ht 65.0 in | Wt 218.4 lb

## 2019-10-27 DIAGNOSIS — Z1159 Encounter for screening for other viral diseases: Secondary | ICD-10-CM

## 2019-10-27 DIAGNOSIS — Z86010 Personal history of colonic polyps: Secondary | ICD-10-CM

## 2019-10-27 MED ORDER — NA SULFATE-K SULFATE-MG SULF 17.5-3.13-1.6 GM/177ML PO SOLN: ORAL | 0 refills | Status: DC

## 2019-10-27 NOTE — Progress Notes (Signed)
Patient is here in-person for PV. Patient denies any allergies to eggs or soy. Patient denies any problems with anesthesia/sedation. Patient denies any oxygen use at home. Patient denies taking any diet/weight loss medications or blood thinners. Patient is not being treated for MRSA or C-diff. EMMI education assisgned to the patient for the procedure, this was explained and instructions given to patient. COVID-19 screening test is on 12/8 320pm, the pt is aware. Pt is aware that care partner will wait in the car during procedure; if they feel like they will be too hot or cold to wait in the car; they may wait in the 4 th floor lobby. Patient is aware to bring only one care partner. We want them to wear a mask (we do not have any that we can provide them), practice social distancing, and we will check their temperatures when they get here.  I did remind the patient that their care partner needs to stay in the parking lot the entire time and have a cell phone available, we will call them when the pt is ready for discharge. Patient will wear mask into building.    Suprep $15 off coupon given to the patient.

## 2019-10-28 ENCOUNTER — Encounter: Payer: Self-pay | Admitting: Gastroenterology

## 2019-11-11 ENCOUNTER — Other Ambulatory Visit: Payer: Self-pay | Admitting: Gastroenterology

## 2019-11-11 ENCOUNTER — Ambulatory Visit (INDEPENDENT_AMBULATORY_CARE_PROVIDER_SITE_OTHER): Payer: PRIVATE HEALTH INSURANCE

## 2019-11-11 DIAGNOSIS — Z1159 Encounter for screening for other viral diseases: Secondary | ICD-10-CM

## 2019-11-12 LAB — SARS CORONAVIRUS 2 (TAT 6-24 HRS): SARS Coronavirus 2: NEGATIVE

## 2019-11-14 ENCOUNTER — Other Ambulatory Visit: Payer: Self-pay

## 2019-11-14 ENCOUNTER — Encounter: Payer: Self-pay | Admitting: Gastroenterology

## 2019-11-14 ENCOUNTER — Ambulatory Visit (AMBULATORY_SURGERY_CENTER): Payer: PRIVATE HEALTH INSURANCE | Admitting: Gastroenterology

## 2019-11-14 VITALS — BP 106/56 | HR 62 | Temp 98.4°F | Resp 17 | Ht 65.0 in | Wt 218.0 lb

## 2019-11-14 DIAGNOSIS — D120 Benign neoplasm of cecum: Secondary | ICD-10-CM

## 2019-11-14 DIAGNOSIS — D125 Benign neoplasm of sigmoid colon: Secondary | ICD-10-CM | POA: Diagnosis not present

## 2019-11-14 DIAGNOSIS — Z86010 Personal history of colonic polyps: Secondary | ICD-10-CM

## 2019-11-14 DIAGNOSIS — K621 Rectal polyp: Secondary | ICD-10-CM

## 2019-11-14 DIAGNOSIS — K649 Unspecified hemorrhoids: Secondary | ICD-10-CM

## 2019-11-14 DIAGNOSIS — K5730 Diverticulosis of large intestine without perforation or abscess without bleeding: Secondary | ICD-10-CM

## 2019-11-14 DIAGNOSIS — D123 Benign neoplasm of transverse colon: Secondary | ICD-10-CM

## 2019-11-14 DIAGNOSIS — D128 Benign neoplasm of rectum: Secondary | ICD-10-CM

## 2019-11-14 MED ORDER — SODIUM CHLORIDE 0.9 % IV SOLN: 500.0000 mL | Freq: Once | INTRAVENOUS | Status: DC

## 2019-11-14 NOTE — Patient Instructions (Signed)
USE FIBER, FOR EXAMPLE CITRUCEL, FIBERCON, KONSYL OR METAMUCIL. DIVERTICULOSIS, NON-BLEEDING INTERNAL HEMORRHOIDS NOTED TODAY. 15 POLYPS REMOVED.  YOU WILL NEED YOUR NEXT COLONOSCOPY IN 3 YEARS FOR SURVEILLANCE. LIPOMA FOUND IN ASCENDING COLON.     YOU HAD AN ENDOSCOPIC PROCEDURE TODAY AT [facility]:   Refer to the procedure report that was given to you for any specific questions about what was found during the examination.  If the procedure report does not answer your questions, please call your gastroenterologist to clarify.  If you requested that your care partner not be given the details of your procedure findings, then the procedure report has been included in a sealed envelope for you to review at your convenience later.  YOU SHOULD EXPECT: Some feelings of bloating in the abdomen. Passage of more gas than usual.  Walking can help get rid of the air that was put into your GI tract during the procedure and reduce the bloating. If you had a lower endoscopy (such as a colonoscopy or flexible sigmoidoscopy) you may notice spotting of blood in your stool or on the toilet paper. If you underwent a bowel prep for your procedure, you may not have a normal bowel movement for a few days.  Please Note:  You might notice some irritation and congestion in your nose or some drainage.  This is from the oxygen used during your procedure.  There is no need for concern and it should clear up in a day or so.  SYMPTOMS TO REPORT IMMEDIATELY:   Following lower endoscopy (colonoscopy or flexible sigmoidoscopy):  Excessive amounts of blood in the stool  Significant tenderness or worsening of abdominal pains  Swelling of the abdomen that is new, acute  Fever of 100F or higher   For urgent or emergent issues, a gastroenterologist can be reached at any hour by calling [phone].   DIET:  We do recommend a small meal at first, but then you may proceed to your regular diet.  Drink plenty  of fluids but you should avoid alcoholic beverages for 24 hours.  ACTIVITY:  You should plan to take it easy for the rest of today and you should NOT DRIVE or use heavy machinery until tomorrow (because of the sedation medicines used during the test).    FOLLOW UP: Our staff will call the number listed on your records 48-72 hours following your procedure to check on you and address any questions or concerns that you may have regarding the information given to you following your procedure. If we do not reach you, we will leave a message.  We will attempt to reach you two times.  During this call, we will ask if you have developed any symptoms of COVID 19. If you develop any symptoms (ie: fever, flu-like symptoms, shortness of breath, cough etc.) before then, please call [phone].  If you test positive for Covid 19 in the 2 weeks post procedure, please call and report this information to [place].    If any biopsies were taken you will be contacted by phone or by letter within the next 1-3 weeks.  Please call us at [phone] if you have not heard about the biopsies in 3 weeks.    SIGNATURES/CONFIDENTIALITY: You and/or your care partner have signed paperwork which will be entered into your electronic medical record.  These signatures attest to the fact that that the information above on your After Visit Summary has been reviewed and is understood.  Full responsibility of  the confidentiality of this discharge information lies with you and/or your care-partner. 

## 2019-11-14 NOTE — Progress Notes (Signed)
Pt tolerated well. VSS. Awake and to recovery. 

## 2019-11-14 NOTE — Op Note (Addendum)
[facility] Patient Name: Jennifer Werner Procedure Date: 11/14/2019 9:38 AM MRN: [id number] Endoscopist: Gerrit Heck , MD Age: 58 Referring MD:  Date of Birth: 1961-01-15 Gender: Female Account #: [id number] Procedure:                Colonoscopy Indications:              High risk colon cancer surveillance: Personal                            history of multiple (3 or more) adenomas                           Colonoscopy in 09/2018 with 4 tubular adenomas                            resected from transverse colon and sigmoid, along                            with 15 small HPs resected from rectosigmoid                            colon/rectum, with recommendation to repeat in 1                            year. Medicines:                Monitored Anesthesia Care Procedure:                Pre-Anesthesia Assessment:                           - Prior to the procedure, a History and Physical                            was performed, and patient medications and                            allergies were reviewed. The patient's tolerance of                            previous anesthesia was also reviewed. The risks                            and benefits of the procedure and the sedation                            options and risks were discussed with the patient.                            All questions were answered, and informed consent                            was obtained. Prior Anticoagulants: The patient has  taken no previous anticoagulant or antiplatelet                            agents. ASA Grade Assessment: III - A patient with                            severe systemic disease. After reviewing the risks                            and benefits, the patient was deemed in                            satisfactory condition to undergo the procedure.                           After obtaining informed consent, the colonoscope      was passed under direct vision. Throughout the                            procedure, the patient's blood pressure, pulse, and                            oxygen saturations were monitored continuously. The                            Colonoscope was introduced through the anus and                            advanced to the the cecum, identified by                            appendiceal orifice and ileocecal valve. The                            colonoscopy was performed without difficulty. The                            patient tolerated the procedure well. The quality                            of the bowel preparation was adequate. The                            ileocecal valve, appendiceal orifice, and rectum                            were photographed. Scope In: 9:41:10 AM Scope Out: 10:07:51 AM Scope Withdrawal Time: 0 hours 24 minutes 24 seconds  Total Procedure Duration: 0 hours 26 minutes 41 seconds  Findings:                 Hemorrhoids were found on perianal exam.                           Three sessile polyps were found  in the sigmoid                            colon, transverse colon and cecum. The polyps were                            3 to 5 mm in size. These polyps were removed with a                            cold snare. Resection and retrieval were complete.                            Estimated blood loss was minimal.                           Two sessile polyps were found in the sigmoid colon.                            The polyps were 4 to 5 mm in size. These polyps                            were removed with a cold snare. Resection and                            retrieval were complete. Estimated blood loss was                            minimal.                           Many sessile polyps were found in the rectum. The                            polyps were 1 to 3 mm in size. 10 of these polyps                            were removed with a cold snare. Resection  and                            retrieval were complete. Estimated blood loss was                            minimal.                           Multiple small and large-mouthed diverticula were                            found in the sigmoid colon, descending colon,                            transverse colon and ascending colon.  There was a lipoma, in the ascending colon.                           Non-bleeding internal hemorrhoids were found during                            retroflexion. The hemorrhoids were small. Complications:            No immediate complications. Estimated Blood Loss:     Estimated blood loss was minimal. Impression:               - Hemorrhoids found on perianal exam.                           - Three 3 to 5 mm polyps in the sigmoid colon, in                            the transverse colon and in the cecum, removed with                            a cold snare. Resected and retrieved.                           - Two 4 to 5 mm polyps in the sigmoid colon,                            removed with a cold snare. Resected and retrieved.                           - Many 1 to 3 mm polyps in the rectum, removed with                            a cold snare. Resected and retrieved.                           - Diverticulosis in the sigmoid colon, in the                            descending colon, in the transverse colon and in                            the ascending colon.                           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                           - Non-bleeding internal hemorrhoids. Recommendation:           - Patient has a contact number available for                            emergencies. The signs and symptoms of potential  delayed complications were discussed with the                            patient. Return to normal activities tomorrow.                            Written discharge instructions were provided  to the                            patient.                           - Resume previous diet.                           - Continue present medications.                           - Await pathology results.                           - Repeat colonoscopy in 3 years for surveillance.                           - Return to GI office PRN.                           - Use fiber, for example Citrucel, Fibercon, Konsyl                            or Metamucil.                           - If hemorrhoids are symptomatic, can arrange for                            referral to Colorectal surgery for evaluation and                            intervention as indicated. Gerrit Heck, MD 11/14/2019 10:19:30 AM

## 2019-11-14 NOTE — Progress Notes (Signed)
Pt's states no medical or surgical changes since previsit or office visit.Vitals-Corrina Temp-JB

## 2019-11-18 ENCOUNTER — Other Ambulatory Visit: Payer: Self-pay | Admitting: Internal Medicine

## 2019-11-18 ENCOUNTER — Telehealth: Payer: Self-pay | Admitting: *Deleted

## 2019-11-18 NOTE — Telephone Encounter (Signed)
  Follow up Call-  Call back number 11/14/2019 09/20/2018  Post procedure Call Back phone  # [phone]  Permission to leave phone message Yes Yes  Some recent data might be hidden     Patient questions:  Do you have a fever, pain , or abdominal swelling? No. Pain Score  0 *  Have you tolerated food without any problems? Yes.    Have you been able to return to your normal activities? Yes.    Do you have any questions about your discharge instructions: Diet   No. Medications  No. Follow up visit  No.  Do you have questions or concerns about your Care? No.  Actions: * If pain score is 4 or above: No action needed, pain <4.   1. Have you developed a fever since your procedure? no  2.   Have you had an respiratory symptoms (SOB or cough) since your procedure? no  3.   Have you tested positive for COVID 19 since your procedure no  4.   Have you had any family members/close contacts diagnosed with the COVID 19 since your procedure?  no   If yes to any of these questions please route to Joylene John, RN and Alphonsa Gin, Therapist, sports.

## 2019-11-21 ENCOUNTER — Encounter: Payer: Self-pay | Admitting: Gastroenterology

## 2019-12-23 ENCOUNTER — Other Ambulatory Visit: Payer: Self-pay

## 2019-12-26 ENCOUNTER — Other Ambulatory Visit: Payer: Self-pay

## 2019-12-26 NOTE — Telephone Encounter (Signed)
No medication listed on the request form.

## 2020-01-05 ENCOUNTER — Other Ambulatory Visit: Payer: Self-pay | Admitting: Physician Assistant

## 2020-01-05 DIAGNOSIS — Z87891 Personal history of nicotine dependence: Secondary | ICD-10-CM

## 2020-01-05 DIAGNOSIS — L732 Hidradenitis suppurativa: Secondary | ICD-10-CM

## 2020-01-05 HISTORY — DX: Hidradenitis suppurativa: L73.2

## 2020-01-13 ENCOUNTER — Ambulatory Visit
Admission: RE | Admit: 2020-01-13 | Discharge: 2020-01-13 | Disposition: A | Discharge status: Home / Self Care | Payer: PRIVATE HEALTH INSURANCE | Source: Ambulatory Visit | Attending: Physician Assistant | Admitting: Physician Assistant

## 2020-01-13 DIAGNOSIS — Z87891 Personal history of nicotine dependence: Secondary | ICD-10-CM

## 2020-04-05 DIAGNOSIS — E669 Obesity, unspecified: Secondary | ICD-10-CM

## 2020-04-05 DIAGNOSIS — E66811 Obesity, class 1: Secondary | ICD-10-CM

## 2020-04-05 HISTORY — DX: Obesity, unspecified: E66.9

## 2020-04-05 HISTORY — DX: Obesity, class 1: E66.811

## 2020-04-06 DIAGNOSIS — E7800 Pure hypercholesterolemia, unspecified: Secondary | ICD-10-CM

## 2020-04-06 HISTORY — DX: Pure hypercholesterolemia, unspecified: E78.00

## 2020-04-12 ENCOUNTER — Other Ambulatory Visit: Payer: Self-pay | Admitting: Family

## 2020-06-08 ENCOUNTER — Ambulatory Visit: Payer: PRIVATE HEALTH INSURANCE | Admitting: Internal Medicine

## 2020-07-19 ENCOUNTER — Other Ambulatory Visit: Payer: Self-pay

## 2020-07-19 ENCOUNTER — Encounter: Payer: Self-pay | Admitting: Internal Medicine

## 2020-07-19 ENCOUNTER — Ambulatory Visit: Payer: PRIVATE HEALTH INSURANCE | Admitting: Internal Medicine

## 2020-07-19 DIAGNOSIS — J449 Chronic obstructive pulmonary disease, unspecified: Secondary | ICD-10-CM | POA: Diagnosis not present

## 2020-07-19 DIAGNOSIS — F17210 Nicotine dependence, cigarettes, uncomplicated: Secondary | ICD-10-CM

## 2020-07-19 MED ORDER — BUDESONIDE-FORMOTEROL FUMARATE 160-4.5 MCG/ACT IN AERO: INHALATION_SPRAY | RESPIRATORY_TRACT | 3 refills | Status: DC

## 2020-07-19 MED ORDER — SPIRIVA RESPIMAT 2.5 MCG/ACT IN AERS: 2.0000 | INHALATION_SPRAY | Freq: Every day | RESPIRATORY_TRACT | 3 refills | Status: DC

## 2020-07-19 NOTE — Progress Notes (Signed)
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 worse in winter with baseline 120 lf then doe x 2017 worse since winter of 2019 rx with advair 500 dose /combivent and prednisone > 50% back to baseline so referred to pulmonary clinic 05/21/2018 by Windell Hummingbird.   Sleep study 05/15/18 c/w AHI 8 but desats > plan cpap titration byDr Rexene Alberts    05/21/2018 1st [facility] office visit/ Dillyn Joaquin   Chief Complaint  Patient presents with  . Pulmonary Consult    Referred by Windell Hummingbird, PA for eval of hypoxia.  She states she has been having SOB since Winter 2017. She states she presented to her PCP with SOB in April 2019 and had o2 sat of 88%ra. She states she gets SOB walking for approx 5-10 min.  She has occ cough, prod in the am after inhaler with tan sputum.    sleeps horizontal rotated on L side wakes up each am feeling tired / hits snooze button twice /some am cough/ congestion and then uses combivent x one and w/in 15 min better then later on in the am finally gets around to taking the advair 500. Breathing worse in hot weather. 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 Plan A = Automatic = Symbicort 160 Take 2 puffs first thing in am and then another 2 puffs about 12 hours later.  Work on inhaler technique:  relax and gently blow all the way out then take a nice smooth deep breath back in, triggering the inhaler at same time you start breathing in.  Hold for up to 5 seconds if you can. Blow out thru nose. Rinse and gargle with water when done Plan B = Backup Only use your combivent as a rescue  medication    08/22/2018  f/u ov/Shawnna Pancake re: copd /ab component  Chief Complaint  Patient presents with  . Follow-up    PFT results, having nightly heartburn annd reflux, SHOB with ambulation  Dyspnea:  MMRC2 = can't walk a nl pace on a flat grade s sob but does fine slow and flat / also some back pain  Cough: gone Sleeping: L side down / 1 pillow / flat with cpap SABA use: avg twice weekly  02: no  rec Stop comibivent and add spiriva 2 pffs each am after your am symbicort  Work on inhaler technique:   Only use your albuterol as a rescue medication       12/06/2018  f/u ov/Tichina Koebel re:  Copd GOLD II off cigs since /05/2018 / maint symb/spiriva  Chief Complaint  Patient presents with  . Follow-up    Breathing is unchanged. She is using her proair inhaler 1-2 x per wk.  Dyspnea:  MMRC2 = can't walk a nl pace on a flat grade s sob but does fine slow and flat [employer] ok Cough: no Sleeping: cpap bed horizontal / one pillow SABA use: rare 02: none  rec Plan A = Automatic = Bevespi Take 2  puffs first thing in am and then another 2 puffs about 12 hours later.   Stop symbiocort and spriva  Work on inhaler technique:  Plan B = Backup Only use your albuterol inhaler as a rescue medication  If not happy with bevespi we can call in stiolto which is the same device as spiriva Low-dose CT lung cancer screening is recommended for patients who are 55-58 years of age with a 30+ pack-year history of smoking, and who are currently smoking or quit <=15 years ago.        06/09/2019  f/u ov/Isaly Fasching re:  GOLD II symb/spiriva  Chief Complaint  Patient presents with  . Follow-up    Breathing is overall doing well and no new co's. She is using her proair once per wk on average.   Dyspnea:  MMRC1 = can walk nl pace, flat grade, can't hurry or go uphills or steps s sob   Cough: none Sleeping: cpap, one pillow bed flat SABA use: rarely  02: none  rec No change   07/19/2020  f/u ov/Tarynn Garling re:  GOLD II   Symb/spiriva  Still smoking but < 1 pk per week  Chief Complaint  Patient presents with  . Follow-up    pt has chest tightness. pt says inhaler opens her up  Dyspnea: still  MMRC1 = can walk nl pace, flat grade, can't hurry or go uphills or steps s sob   Cough:  First thing in am / mucoid  Sleeping: cpap flat / one pillow  SABA use: 3-4 x weekly  02: none    No obvious day to day or daytime variability or assoc excess/ purulent sputum or mucus plugs or hemoptysis or cp or   subjective wheeze or overt sinus or hb symptoms.   Sleeping  without nocturnal  or early am exacerbation  of respiratory  c/o's or need for noct saba. Also denies any obvious fluctuation of symptoms with weather or environmental changes or other aggravating or alleviating factors except as outlined above   No unusual exposure hx or h/o childhood pna/ asthma or knowledge of premature birth.  Current Allergies, Complete Past Medical History, Past Surgical History, Family History, and Social History were reviewed in [employer] record.  ROS  The following are not active complaints unless bolded Hoarseness, sore throat, dysphagia, dental problems, itching, sneezing,  nasal congestion or discharge of excess mucus or purulent secretions, ear ache,   fever, chills, sweats, unintended wt loss or wt gain, classically pleuritic or exertional cp,  orthopnea pnd or arm/hand swelling  or leg swelling, presyncope, palpitations, abdominal pain, anorexia, nausea, vomiting, diarrhea  or change in bowel habits or change in bladder habits, change in stools or change in urine, dysuria, hematuria,  rash, arthralgias, visual complaints, headache, numbness, weakness or ataxia or problems with walking or coordination,  change in mood or  memory.        Current Meds  Medication Sig  . albuterol (PROAIR HFA) 108 (90 Base) MCG/ACT inhaler 2 puffs every 4 hours as needed only  if your can't catch your breath  . aspirin EC 81  MG tablet Take 81 mg by mouth daily.  . Cholecalciferol (VITAMIN D3) 1.25 MG (50000 UT) CAPS Take by mouth.  . furosemide (LASIX) 40 MG tablet TAKE 1 TABLET BY MOUTH EVERY DAY  . gabapentin (NEURONTIN) 100 MG capsule Take 100 mg by mouth 3 (three) times daily.  Marland Kitchen ibuprofen (ADVIL,MOTRIN) 200 MG tablet Take 400 mg by  mouth as needed.  Marland Kitchen KLOR-CON M10 10 MEQ tablet TAKE 1 TABLET BY MOUTH EVERY DAY  . metFORMIN (GLUCOPHAGE) 500 MG tablet Take 500 mg by mouth 2 (two) times daily.   . Multiple Vitamin (MULTIVITAMIN) tablet Take 1 tablet by mouth daily.  . Omega-3 Fatty Acids (FISH OIL) 1000 MG CAPS Take by mouth.  Marland Kitchen OVER THE COUNTER MEDICATION Vitamin C 1000 mg one tablet daily.  Marland Kitchen OVER THE COUNTER MEDICATION Pro- biotic one capsule daily.  Marland Kitchen SPIRIVA RESPIMAT 2.5 MCG/ACT AERS Inhale 2 puffs into the lungs daily.  . SYMBICORT 160-4.5 MCG/ACT inhaler INHALE 2 PUFFS FIRST THING IN THE MORNING THEN ANOTHER 2 PUFFS 12 HOURS LATER                Objective:   Physical Exam   amb pleasant wf typical smoker's rattle/ congested sounding cough     07/19/2020       189  06/09/2019         237  12/06/2018         233  08/22/2018       223   05/21/18 216 lb (98 kg)  04/30/18 214 lb 1.9 oz (97.1 kg)  04/25/18 215 lb (97.5 kg)     Vital signs reviewed  07/19/2020  - Note at rest 02 sats  98% on RA    HEENT : pt wearing mask not removed for exam due to covid - 19 concerns.   NECK :  without JVD/Nodes/TM/ nl carotid upstrokes bilaterally   LUNGS: no acc muscle use,  Min barrel  contour chest wall with bilateral  slightly decreased bs s audible wheeze and  without cough on insp or exp maneuvers and min  Hyperresonant  to  percussion bilaterally     CV:  RRR  no s3 or murmur or increase in P2, and no edema   ABD:  soft and nontender with pos end  insp Hoover's  in the supine position. No bruits or organomegaly appreciated, bowel sounds nl  MS:   Nl gait/  ext warm without deformities, calf  tenderness, cyanosis or clubbing No obvious joint restrictions   SKIN: warm and dry without lesions    NEURO:  alert, approp, nl sensorium with  no motor or cerebellar deficits apparent.           I personally reviewed images and agree with radiology impression as follows:   Chest LCS 01/13/20 Lung-RADS Category 2, benign appearance or behavior. Continue annual screening with low-dose chest CT without contrast in 12 months.           Assessment:

## 2020-07-19 NOTE — Patient Instructions (Addendum)
Continue symbicort / spiriva as you are   The key is to stop smoking completely before smoking completely stops you!   Please schedule a follow up visit in 12  months but call sooner if needed

## 2020-07-20 ENCOUNTER — Encounter: Payer: Self-pay | Admitting: Internal Medicine

## 2020-07-20 NOTE — Assessment & Plan Note (Addendum)
Counseled re importance of smoking cessation but did not meet time criteria for separate billing    I took an extended  opportunity with this patient to outline the consequences of continued cigarette use  in airway disorders based on all the data we have from the multiple national lung health studies (perfomed over decades at millions of dollars in cost)  indicating that smoking cessation, not choice of inhalers or physicians, is the most important aspect of her care esp since presently on max rx  Continue Lung cancer screening per PCP but advised most studies show stopping smoking prevents more death from lung ca than screening with CT         Each maintenance medication was reviewed in detail including emphasizing most importantly the difference between maintenance and prns and under what circumstances the prns are to be triggered using an action plan format where appropriate.  Total time for H and P, chart review, counseling, teaching device and generating customized AVS unique to this office visit / charting = 20 min

## 2020-07-20 NOTE — Assessment & Plan Note (Addendum)
Quit smoking 05/2018  05/21/2018  Walked RA x 3 laps @ 185 ft each stopped due to  End of study, fast pace, no desat but sob at end of study   Spirometry 05/21/2018  FEV1 1.22 (45%)  Ratio 58 p am advair 500 PFT's  08/22/2018  FEV1 1.50 (56 % ) ratio 65  p 20 % improvement from saba p symb 160 x2  prior to study with DLCO  72 % corrects to 90 % for alv volume   - 12/06/2018   try bevespi 2 bid if insurance covers> preferred symb/spiriva  - 06/09/2019  After extensive coaching inhaler device,  effectiveness =    75% (short ti)   Reviewed again proper inhaler technique.   Continues  Group D in terms of symptom/risk and laba/lama/ICS  therefore appropriate rx at this point >>>  symb 160/spiriva ok with breztri worth considering esp with zero pay coupon available but pt wants to continue with present rx and work on techniques/ stopping smoking

## 2020-08-06 ENCOUNTER — Other Ambulatory Visit: Payer: Self-pay | Admitting: Internal Medicine

## 2020-10-08 ENCOUNTER — Other Ambulatory Visit: Payer: Self-pay | Admitting: Cardiology

## 2020-10-13 ENCOUNTER — Encounter: Payer: Self-pay | Admitting: Adult Health

## 2020-10-13 ENCOUNTER — Ambulatory Visit: Payer: PRIVATE HEALTH INSURANCE | Admitting: Adult Health

## 2020-10-13 VITALS — BP 120/84 | Ht 64.0 in | Wt 191.8 lb

## 2020-10-13 DIAGNOSIS — G4733 Obstructive sleep apnea (adult) (pediatric): Secondary | ICD-10-CM | POA: Diagnosis not present

## 2020-10-13 DIAGNOSIS — Z9989 Dependence on other enabling machines and devices: Secondary | ICD-10-CM

## 2020-10-13 NOTE — Patient Instructions (Signed)
Continue using CPAP nightly and greater than 4 hours each night °If your symptoms worsen or you develop new symptoms please let us know.  ° °

## 2020-10-13 NOTE — Progress Notes (Addendum)
PATIENT: Jennifer Werner DOB: 1961-08-27  REASON FOR VISIT: follow up HISTORY FROM: patient  HISTORY OF PRESENT ILLNESS: Today 10/13/20:  Jennifer Werner is a 59 year old female with a history of obstructive sleep apnea on CPAP.  Her download indicates that she use her machine nightly for compliance of 100%.  She used her machine greater than 4 hours each night.  On average she uses her machine 8 hours and 29 minutes.  Her residual AHI is 0.9 on 14 cm of water.  Leak in the 95th percentile is 26.6 L/min.  Overall she is doing well.  She returns today for evaluation.  HISTORY 10/14/19:  Jennifer Werner is a 59 year old female with a history of obstructive sleep apnea on CPAP.  Her download indicates that she use her machine nightly for compliance of 100%.  She used her machine greater than 4 hours each night.  On average she uses her machine 8 hours and 55 minutes.  Her residual AHI is 1.1 on 14 cmH2O with EPR of 1.  Her leak in the 95th percentile is 21.8 L/min.  She reports that the CPAP is working well for her.  She returns today for evaluation.  REVIEW OF SYSTEMS: Out of a complete 14 system review of symptoms, the patient complains only of the following symptoms, and all other reviewed systems are negative.  FSS  ESS  ALLERGIES: Allergies  Allergen Reactions  . Latex Rash  . Codeine     HOME MEDICATIONS: Outpatient Medications Prior to Visit  Medication Sig Dispense Refill  . albuterol (PROAIR HFA) 108 (90 Base) MCG/ACT inhaler 2 puffs every 4 hours as needed only  if your can't catch your breath 1 Inhaler 1  . aspirin EC 81 MG tablet Take 81 mg by mouth daily.    . budesonide-formoterol (SYMBICORT) 160-4.5 MCG/ACT inhaler INHALE 2 PUFFS FIRST THING IN THE MORNING THEN ANOTHER 2 PUFFS 12 HOURS LATER 30.6 g 3  . Cholecalciferol (VITAMIN D3) 1.25 MG (50000 UT) CAPS Take by mouth.    . furosemide (LASIX) 40 MG tablet TAKE 1 TABLET BY MOUTH EVERY DAY 90 tablet 1  . gabapentin  (NEURONTIN) 100 MG capsule Take 100 mg by mouth 3 (three) times daily.    Marland Kitchen ibuprofen (ADVIL,MOTRIN) 200 MG tablet Take 400 mg by mouth as needed.    Marland Kitchen KLOR-CON M10 10 MEQ tablet TAKE 1 TABLET BY MOUTH EVERY DAY 90 tablet 1  . metFORMIN (GLUCOPHAGE) 500 MG tablet Take 500 mg by mouth 2 (two) times daily.     . Multiple Vitamin (MULTIVITAMIN) tablet Take 1 tablet by mouth daily.    . Omega-3 Fatty Acids (FISH OIL) 1000 MG CAPS Take by mouth.    Marland Kitchen OVER THE COUNTER MEDICATION Vitamin C 1000 mg one tablet daily.    Marland Kitchen OVER THE COUNTER MEDICATION Pro- biotic one capsule daily.    Marland Kitchen SPIRIVA RESPIMAT 2.5 MCG/ACT AERS INHALE 2 PUFFS BY MOUTH INTO THE LUNGS DAILY 4 g 11   No facility-administered medications prior to visit.    PAST MEDICAL HISTORY: Past Medical History:  Diagnosis Date  . Allergy   . Anemia   . Arthritis   . Cancer ([place])    skin   . COPD (chronic obstructive pulmonary disease) ([place])   . Eczema   . GERD (gastroesophageal reflux disease)   . Right carpal tunnel syndrome 10/14/2018  . Sleep apnea    wears CPAP nightly  . Smoker     PAST SURGICAL  HISTORY: Past Surgical History:  Procedure Laterality Date  . ABLATION     Uterus  . COLONOSCOPY  09/20/2018  . ENDOVENOUS ABLATION SAPHENOUS VEIN W/ LASER Left 09/25/2018   endovenous laser ablation left greater saphenous vein by Ruta Hinds MD   . EXCISION MASS HEAD Right 12/02/2018   Procedure: EXCISION RIGHT PERIORBITAL SEBACEOUS CYST;  Surgeon: Wallace Going, DO;  Location: [facility];  Service: Plastics;  Laterality: Right;  . POLYPECTOMY      FAMILY HISTORY: Family History  Problem Relation Age of Onset  . Heart block Mother   . Lymphoma Mother   . Congestive Heart Failure Mother   . Cardiomyopathy Father   . Alcohol abuse Father   . Cardiomyopathy Brother        Had Heart Transplant  . Alcohol abuse Brother   . Asthma Brother   . Cardiomyopathy Paternal Aunt   . Cardiomyopathy  Paternal Uncle   . Rheum arthritis Paternal Grandmother   . Colon cancer Neg Hx   . Esophageal cancer Neg Hx   . Rectal cancer Neg Hx   . Stomach cancer Neg Hx   . Colon polyps Neg Hx     SOCIAL HISTORY: Social History   Socioeconomic History  . Marital status: Married    Spouse name: Not on file  . Number of children: Not on file  . Years of education: Not on file  . Highest education level: Not on file  Occupational History  . Not on file  Tobacco Use  . Smoking status: Current Some Day Smoker    Packs/day: 0.25    Years: 47.00    Pack years: 11.75    Types: Cigarettes    Last attempt to quit: 05/31/2018    Years since quitting: 2.3  . Smokeless tobacco: Never Used  Vaping Use  . Vaping Use: Never used  Substance and Sexual Activity  . Alcohol use: Yes    Alcohol/week: 6.0 standard drinks    Types: 6 Standard drinks or equivalent per week  . Drug use: Never  . Sexual activity: Yes    Birth control/protection: Surgical  Other Topics Concern  . Not on file  Social History Narrative   RN - desk work mainly   Lives with husband   Social Determinants of Radio broadcast assistant Strain:   . Difficulty of Paying Living Expenses: Not on file  Food Insecurity:   . Worried About Charity fundraiser in the Last Year: Not on file  . Ran Out of Food in the Last Year: Not on file  Transportation Needs:   . Lack of Transportation (Medical): Not on file  . Lack of Transportation (Non-Medical): Not on file  Physical Activity:   . Days of Exercise per Week: Not on file  . Minutes of Exercise per Session: Not on file  Stress:   . Feeling of Stress : Not on file  Social Connections:   . Frequency of Communication with Friends and Family: Not on file  . Frequency of Social Gatherings with Friends and Family: Not on file  . Attends Religious Services: Not on file  . Active Member of Clubs or Organizations: Not on file  . Attends Archivist Meetings: Not on file   . Marital Status: Not on file  Intimate Partner Violence:   . Fear of Current or Ex-Partner: Not on file  . Emotionally Abused: Not on file  . Physically Abused: Not on file  .  Sexually Abused: Not on file      PHYSICAL EXAM  Vitals:   10/13/20 1445  BP: 120/84  Weight: 191 lb 12.8 oz (87 kg)  Height: 5\' 4"  (1.626 m)   Body mass index is 32.92 kg/m.  Generalized: Well developed, in no acute distress  Chest: Lungs clear to auscultation bilaterally  Neurological examination  Mentation: Alert oriented to time, place, history taking. Follows all commands speech and language fluent Cranial nerve II-XII: Extraocular movements were full, visual field were full on confrontational test Head turning and shoulder shrug  were normal and symmetric. Motor: The motor testing reveals 5 over 5 strength of all 4 extremities. Good symmetric motor tone is noted throughout.  Sensory: Sensory testing is intact to soft touch on all 4 extremities. No evidence of extinction is noted.  Gait and station: Gait is normal.    DIAGNOSTIC DATA (LABS, IMAGING, TESTING) - I reviewed patient records, labs, notes, testing and imaging myself where available.  Lab Results  Component Value Date   WBC 8.3 04/02/2018   HGB 16.6 (H) 04/02/2018   HCT 50.3 (H) 04/02/2018   MCV 88 04/02/2018   PLT 260 04/02/2018      Component Value Date/Time   NA 141 11/28/2018 1226   NA 140 08/26/2018 1210   K 3.8 11/28/2018 1226   CL 102 11/28/2018 1226   CO2 29 11/28/2018 1226   GLUCOSE 121 (H) 11/28/2018 1226   BUN 10 11/28/2018 1226   BUN 15 08/26/2018 1210   CREATININE 0.95 11/28/2018 1226   CREATININE 0.70 06/26/2012 1505   CALCIUM 9.8 11/28/2018 1226   PROT 7.2 06/21/2018 1648   ALBUMIN 4.5 06/21/2018 1648   AST 39 06/21/2018 1648   ALT 77 (H) 06/21/2018 1648   ALKPHOS 74 06/21/2018 1648   BILITOT <0.2 06/21/2018 1648   GFRNONAA >60 11/28/2018 1226   GFRAA >60 11/28/2018 1226   Lab Results  Component  Value Date   CHOL 166 04/02/2018   HDL 46 04/02/2018   LDLCALC 102 (H) 04/02/2018   TRIG 90 04/02/2018   CHOLHDL 3.6 04/02/2018   Lab Results  Component Value Date   HGBA1C 5.9 (H) 04/02/2018   No results found for: VITAMINB12 Lab Results  Component Value Date   TSH 0.886 04/02/2018      ASSESSMENT AND PLAN 59 y.o. year old female  has a past medical history of Allergy, Anemia, Arthritis, Cancer ([place]), COPD (chronic obstructive pulmonary disease) ([place]), Eczema, GERD (gastroesophageal reflux disease), Right carpal tunnel syndrome (10/14/2018), Sleep apnea, and Smoker. here with:  1. OSA on CPAP  - CPAP compliance excellent - Good treatment of AHI  - Encourage patient to use CPAP nightly and > 4 hours each night - F/U in 1 year or sooner if needed   I spent 25 minutes of face-to-face and non-face-to-face time with patient.  This included previsit chart review, lab review, study review, order entry, electronic health record documentation, patient education.  Ward Givens, MSN, NP-C 10/13/2020, 12:37 PM [facility] [address] [place], [place] [address] [phone]  I reviewed the above note and documentation by the Nurse Practitioner and agree with the history, exam, assessment and plan as outlined above. I was available for consultation. Star Age, MD, PhD [facility])

## 2020-11-25 ENCOUNTER — Other Ambulatory Visit: Payer: Self-pay

## 2020-11-25 DIAGNOSIS — M1990 Unspecified osteoarthritis, unspecified site: Secondary | ICD-10-CM | POA: Insufficient documentation

## 2020-11-25 DIAGNOSIS — D649 Anemia, unspecified: Secondary | ICD-10-CM | POA: Insufficient documentation

## 2020-11-25 DIAGNOSIS — L309 Dermatitis, unspecified: Secondary | ICD-10-CM | POA: Insufficient documentation

## 2020-11-25 DIAGNOSIS — T7840XA Allergy, unspecified, initial encounter: Secondary | ICD-10-CM | POA: Insufficient documentation

## 2020-11-25 DIAGNOSIS — J449 Chronic obstructive pulmonary disease, unspecified: Secondary | ICD-10-CM | POA: Insufficient documentation

## 2020-11-25 DIAGNOSIS — C801 Malignant (primary) neoplasm, unspecified: Secondary | ICD-10-CM | POA: Insufficient documentation

## 2020-11-29 ENCOUNTER — Ambulatory Visit: Payer: PRIVATE HEALTH INSURANCE | Admitting: Cardiology

## 2020-11-29 ENCOUNTER — Other Ambulatory Visit: Payer: Self-pay

## 2020-11-29 ENCOUNTER — Encounter: Payer: Self-pay | Admitting: Cardiology

## 2020-11-29 VITALS — BP 144/80 | HR 95 | Ht 64.0 in | Wt 196.0 lb

## 2020-11-29 DIAGNOSIS — R0600 Dyspnea, unspecified: Secondary | ICD-10-CM

## 2020-11-29 DIAGNOSIS — R0609 Other forms of dyspnea: Secondary | ICD-10-CM

## 2020-11-29 DIAGNOSIS — Z87891 Personal history of nicotine dependence: Secondary | ICD-10-CM

## 2020-11-29 DIAGNOSIS — E7800 Pure hypercholesterolemia, unspecified: Secondary | ICD-10-CM | POA: Diagnosis not present

## 2020-11-29 DIAGNOSIS — J449 Chronic obstructive pulmonary disease, unspecified: Secondary | ICD-10-CM

## 2020-11-29 DIAGNOSIS — G4733 Obstructive sleep apnea (adult) (pediatric): Secondary | ICD-10-CM | POA: Diagnosis not present

## 2020-11-29 DIAGNOSIS — Z9989 Dependence on other enabling machines and devices: Secondary | ICD-10-CM

## 2020-11-29 NOTE — Progress Notes (Signed)
Cardiology Office Note:    Date:  11/29/2020   ID:  [name], DOB [birth year], MRN [id number]  PCP:  [name], PA-C  Cardiologist:  Jenne Campus, MD    Referring MD: [name], PA-C   Chief Complaint  Patient presents with  . Follow-up  Doing fine, still some short of breath.  History of Present Illness:    Jennifer Werner is a 59 y.o. female with past medical history significant for COPD, history of smoking, obstructive sleep apnea, essential hypertension, dyslipidemia, comes today 2 months of follow-up overall seems to be doing well shortness of breath is still there denies have any palpitation dizziness swelling of lower extremities.  Past Medical History:  Diagnosis Date  . Allergy   . Anemia   . Arthritis   . Cancer ([place])    skin   . Chronic venous insufficiency 05/29/2018  . Cigarette smoker 04/02/2018  . COPD (chronic obstructive pulmonary disease) ([place])   . COPD GOLD  II  04/09/2018   Quit smoking 05/2018  05/21/2018  Walked RA x 3 laps @ 185 ft each stopped due to  End of study, fast pace, no desat but sob at end of study   Spirometry 05/21/2018  FEV1 1.22 (45%)  Ratio 58 p am advair 500 PFT's  08/22/2018  FEV1 1.50 (56 % ) ratio 65  p 20 % improvement from saba p symb 160 x2  prior to study with DLCO  72 % corrects to 90 % for alv volume   - 12/06/2018   try bevespi 2 bid if insura  . Dyspnea on exertion 04/09/2018  . Eczema   . Elevated AST (SGOT) 08/25/2019  . Elevated LDL cholesterol level 04/06/2020  . Equinus deformity of foot, acquired 03/04/2014  . Former smoker 04/02/2018   Formatting of this note might be different from the original. Last Assessment & Plan:  4-5 min discussion re active cigarette smoking in addition to office E&M  Ask about tobacco use:  Ongoing, down to 10 per day Advise quitting:    I emphasized that although we never turn away smokers from the pulmonary clinic, we do ask that they understand that the recommendations that we make  won't  work nearl  . GERD (gastroesophageal reflux disease)   . Hidradenitis suppurativa 01/05/2020  . Insulin resistance 08/25/2019  . Metatarsal deformity 03/04/2014  . Morbid obesity complicated by osa/aodm [id number]  . Obesity, Class I, BMI 30-34.9 04/05/2020  . Obstructive sleep apnea on CPAP 09/09/2018   wears CPAP nightly  . Pedal edema 08/25/2019  . Plantar fasciitis of left foot 03/04/2014  . Right carpal tunnel syndrome 10/14/2018  . Sebaceous cyst 11/05/2018  . Sleep apnea    wears CPAP nightly  . Smoker   . Varicose veins of bilateral lower extremities with other complications [id number]    Past Surgical History:  Procedure Laterality Date  . ABLATION     Uterus  . COLONOSCOPY  09/20/2018  . ENDOVENOUS ABLATION SAPHENOUS VEIN W/ LASER Left 09/25/2018   endovenous laser ablation left greater saphenous vein by Ruta Hinds MD   . EXCISION MASS HEAD Right 12/02/2018   Procedure: EXCISION RIGHT PERIORBITAL SEBACEOUS CYST;  Surgeon: Wallace Going, DO;  Location: [facility];  Service: Plastics;  Laterality: Right;  . POLYPECTOMY      Current Medications: Current Meds  Medication Sig  . albuterol (PROAIR HFA) 108 (90 Base) MCG/ACT inhaler 2 puffs every 4 hours as  needed only  if your can't catch your breath  . ascorbic acid (VITAMIN C) 1000 MG tablet Take 1,000 mg by mouth daily.  Marland Kitchen aspirin EC 81 MG tablet Take 81 mg by mouth daily.  . budesonide-formoterol (SYMBICORT) 160-4.5 MCG/ACT inhaler INHALE 2 PUFFS FIRST THING IN THE MORNING THEN ANOTHER 2 PUFFS 12 HOURS LATER  . Cholecalciferol (VITAMIN D3) 1.25 MG (50000 UT) CAPS Take 30,000 Units by mouth once a week.  . furosemide (LASIX) 40 MG tablet TAKE 1 TABLET BY MOUTH EVERY DAY  . gabapentin (NEURONTIN) 100 MG capsule Take 100 mg by mouth 3 (three) times daily.  Marland Kitchen ibuprofen (ADVIL,MOTRIN) 200 MG tablet Take 400 mg by mouth as needed.  Marland Kitchen KLOR-CON M10 10 MEQ tablet TAKE 1 TABLET BY MOUTH EVERY DAY  . metFORMIN  (GLUCOPHAGE) 500 MG tablet Take 500 mg by mouth 2 (two) times daily.   . Multiple Vitamin (MULTIVITAMIN) tablet Take 1 tablet by mouth daily.  . Omega-3 Fatty Acids (FISH OIL) 1000 MG CAPS Take by mouth.  . SPIRIVA RESPIMAT 2.5 MCG/ACT AERS INHALE 2 PUFFS BY MOUTH INTO THE LUNGS DAILY  . tiZANidine (ZANAFLEX) 4 MG tablet Take 4 mg by mouth 3 (three) times daily as needed.     Allergies:   Latex and Codeine   Social History   Socioeconomic History  . Marital status: Married    Spouse name: Not on file  . Number of children: Not on file  . Years of education: Not on file  . Highest education level: Not on file  Occupational History  . Not on file  Tobacco Use  . Smoking status: Current Some Day Smoker    Packs/day: 0.25    Years: 47.00    Pack years: 11.75    Types: Cigarettes    Last attempt to quit: 05/31/2018    Years since quitting: 2.5  . Smokeless tobacco: Never Used  Vaping Use  . Vaping Use: Never used  Substance and Sexual Activity  . Alcohol use: Yes    Alcohol/week: 6.0 standard drinks    Types: 6 Standard drinks or equivalent per week  . Drug use: Never  . Sexual activity: Yes    Birth control/protection: Surgical  Other Topics Concern  . Not on file  Social History Narrative   RN - desk work mainly   Lives with husband   Social Determinants of Corporate investment banker Strain: Not on file  Food Insecurity: Not on file  Transportation Needs: Not on file  Physical Activity: Not on file  Stress: Not on file  Social Connections: Not on file     Family History: The patient's family history includes Alcohol abuse in her brother and father; Asthma in her brother; Cardiomyopathy in her brother, father, paternal aunt, and paternal uncle; Congestive Heart Failure in her mother; Heart block in her mother; Lymphoma in her mother; Rheum arthritis in her paternal grandmother. There is no history of Colon cancer, Esophageal cancer, Rectal cancer, Stomach cancer, or  Colon polyps. ROS:   Please see the history of present illness.    All 14 point review of systems negative except as described per history of present illness  EKGs/Labs/Other Studies Reviewed:      Recent Labs: No results found for requested labs within last 8760 hours.  Recent Lipid Panel    Component Value Date/Time   CHOL 166 04/02/2018 1222   TRIG 90 04/02/2018 1222   HDL 46 04/02/2018 1222   CHOLHDL 3.6  04/02/2018 1222   LDLCALC 102 (H) 04/02/2018 1222    Physical Exam:    VS:  BP (!) 144/80 (BP Location: Right Arm, Patient Position: Sitting)   Pulse 95   Ht 5\' 4"  (1.626 m)   Wt 196 lb (88.9 kg)   SpO2 98%   BMI 33.64 kg/m     Wt Readings from Last 3 Encounters:  11/29/20 196 lb (88.9 kg)  10/13/20 191 lb 12.8 oz (87 kg)  07/19/20 189 lb 6.4 oz (85.9 kg)     GEN:  Well nourished, well developed in no acute distress HEENT: Normal NECK: No JVD; No carotid bruits LYMPHATICS: No lymphadenopathy CARDIAC: RRR, no murmurs, no rubs, no gallops RESPIRATORY:  Clear to auscultation without rales, wheezing or rhonchi  ABDOMEN: Soft, non-tender, non-distended MUSCULOSKELETAL:  No edema; No deformity  SKIN: Warm and dry LOWER EXTREMITIES: no swelling NEUROLOGIC:  Alert and oriented x 3 PSYCHIATRIC:  Normal affect   ASSESSMENT:    1. Dyspnea on exertion   2. Elevated LDL cholesterol level   3. Former smoker   4. Obstructive sleep apnea on CPAP   5. COPD GOLD  II     PLAN:    In order of problems listed above:  1. Dyspnea on exertion multifactorial I suspect COPD play significant role here.  I will schedule her to have echocardiogram to assess left ventricle ejection fraction, 2. Elevated LDL.  I will check her fasting lipid profile CLT today. 3. History of smoking. 4. Obstructive sleep apnea followed by pulmonary. 5. COPD.  Stable.  On bronchodilators, followed by pulmonary. 6. Obesity she is disappointed because she gained few pounds.  But she is working hard  on losing some more weight.   Medication Adjustments/Labs and Tests Ordered: Current medicines are reviewed at length with the patient today.  Concerns regarding medicines are outlined above.  No orders of the defined types were placed in this encounter.  Medication changes: No orders of the defined types were placed in this encounter.   Signed, Park Liter, MD, [facility] 11/29/2020 9:10 AM    [place]

## 2020-11-29 NOTE — Patient Instructions (Signed)
Medication Instructions:  Your physician recommends that you continue on your current medications as directed. Please refer to the Current Medication list given to you today.  *If you need a refill on your cardiac medications before your next appointment, please call your pharmacy*   Lab Work: Your physician recommends that you return for lab work in: TODAY Lipids, AST, ALT If you have labs (blood work) drawn today and your tests are completely normal, you will receive your results only by: Marland Kitchen MyChart Message (if you have MyChart) OR . A paper copy in the mail If you have any lab test that is abnormal or we need to change your treatment, we will call you to review the results.   Testing/Procedures: Your physician has requested that you have an echocardiogram. Echocardiography is a painless test that uses sound waves to create images of your heart. It provides your doctor with information about the size and shape of your heart and how well your heart's chambers and valves are working. This procedure takes approximately one hour. There are no restrictions for this procedure.     Follow-Up: At [facility], you and your health needs are our priority.  As part of our continuing mission to provide you with exceptional heart care, we have created designated Provider Care Teams.  These Care Teams include your primary Cardiologist (physician) and Advanced Practice Providers (APPs -  Physician Assistants and Nurse Practitioners) who all work together to provide you with the care you need, when you need it.  We recommend signing up for the patient portal called "MyChart".  Sign up information is provided on this After Visit Summary.  MyChart is used to connect with patients for Virtual Visits (Telemedicine).  Patients are able to view lab/test results, encounter notes, upcoming appointments, etc.  Non-urgent messages can be sent to your provider as well.   To learn more about what you can do with  MyChart, go to [email or web address].    Your next appointment:   6 month(s)  The format for your next appointment:   In Person  Provider:   Gypsy Balsam, MD   Other Instructions

## 2020-11-30 ENCOUNTER — Telehealth: Payer: Self-pay

## 2020-11-30 DIAGNOSIS — E7800 Pure hypercholesterolemia, unspecified: Secondary | ICD-10-CM

## 2020-11-30 LAB — LIPID PANEL
Chol/HDL Ratio: 4.9 ratio — ABNORMAL HIGH (ref 0.0–4.4)
Cholesterol, Total: 236 mg/dL — ABNORMAL HIGH (ref 100–199)
HDL: 48 mg/dL (ref >39)
LDL Chol Calc (NIH): 160 mg/dL — ABNORMAL HIGH (ref 0–99)
Triglycerides: 152 mg/dL — ABNORMAL HIGH (ref 0–149)
VLDL Cholesterol Cal: 28 mg/dL (ref 5–40)

## 2020-11-30 LAB — AST: AST: 23 IU/L (ref 0–40)

## 2020-11-30 LAB — ALT: ALT: 37 IU/L — ABNORMAL HIGH (ref 0–32)

## 2020-11-30 MED ORDER — LISINOPRIL 10 MG PO TABS: 10.0000 mg | ORAL_TABLET | Freq: Every day | ORAL | 1 refills | Status: DC

## 2020-11-30 NOTE — Telephone Encounter (Signed)
-----   Message from Georgeanna Lea, MD sent at 11/30/2020 12:08 PM EST ----- Cholesterol very elevated, need to be on statin.  I recommend 10 mg of Lipitor daily, fasting lipid profile, AST ALT within next 6 weeks

## 2020-11-30 NOTE — Telephone Encounter (Signed)
Patient is aware of test results, requested fasting labs and prescription recommendations and voiced understanding. Lab orders completed and Rx sent.

## 2020-12-24 ENCOUNTER — Ambulatory Visit (HOSPITAL_BASED_OUTPATIENT_CLINIC_OR_DEPARTMENT_OTHER)
Admission: RE | Admit: 2020-12-24 | Discharge: 2020-12-24 | Disposition: A | Discharge status: Home / Self Care | Payer: PRIVATE HEALTH INSURANCE | Source: Ambulatory Visit | Attending: Cardiology | Admitting: Cardiology

## 2020-12-24 ENCOUNTER — Other Ambulatory Visit: Payer: Self-pay

## 2020-12-24 ENCOUNTER — Other Ambulatory Visit: Payer: Self-pay | Admitting: Cardiology

## 2020-12-24 DIAGNOSIS — R0609 Other forms of dyspnea: Secondary | ICD-10-CM

## 2020-12-24 DIAGNOSIS — R0600 Dyspnea, unspecified: Secondary | ICD-10-CM

## 2020-12-24 LAB — ECHOCARDIOGRAM COMPLETE
Area-P 1/2: 2.55 cm2
S' Lateral: 2.78 cm

## 2020-12-24 MED ORDER — ATORVASTATIN CALCIUM 10 MG PO TABS: 10.0000 mg | ORAL_TABLET | Freq: Every day | ORAL | 1 refills | Status: DC

## 2020-12-24 NOTE — Addendum Note (Signed)
Addended by: Senaida Ores on: 12/24/2020 05:07 PM   Modules accepted: Orders

## 2020-12-24 NOTE — Telephone Encounter (Signed)
Called patient to inform her of echo results. During the call she asked why she was on lisinopril she was informed to start this for cholesterol but thought it was lipitor but when she went to pharmacy it was lisinopril. I went back and looks like the wrong medication was sent in for her. I let Dr. Agustin Cree know. The correct medication has been sent in lipitor 10 mg daily and patient knows to stop lisinopril. No further questions.

## 2020-12-24 NOTE — Telephone Encounter (Signed)
Lisinopril was sent on 12/28/2021with 1 refill. Pharmacy notified of denied request(refill to soon)

## 2021-03-10 IMAGING — CT CT CHEST LUNG CANCER SCREENING LOW DOSE W/O CM
2 of 5 series · 14 of 40 positions shown, 17 images · non-contrast
Comparison: None.

CLINICAL DATA: 58-year-old asymptomatic female current smoker with
74 pack-year smoking history.

EXAM:
CT CHEST WITHOUT CONTRAST LOW-DOSE FOR LUNG CANCER SCREENING
TECHNIQUE: Multidetector CT imaging of the chest was performed following the
standard protocol without IV contrast.

[Series 4: lung 1.00 br44 cor · coronal · 0.63mm/px · 3 of 424 slices shown]
[im 85/424  lung]
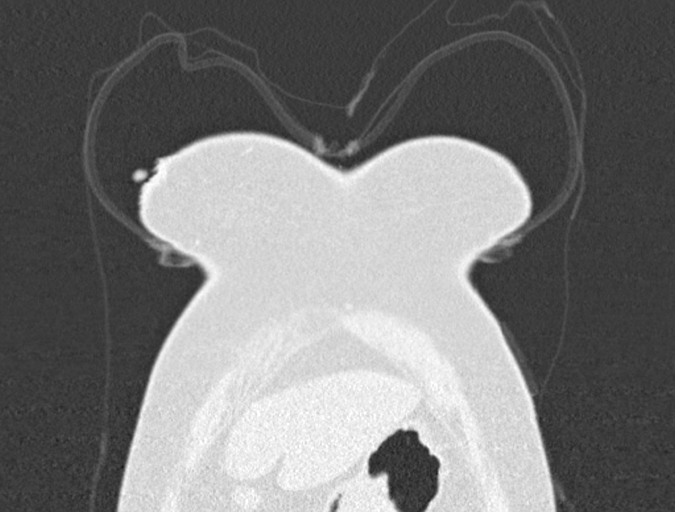
[im 170/424  lung]
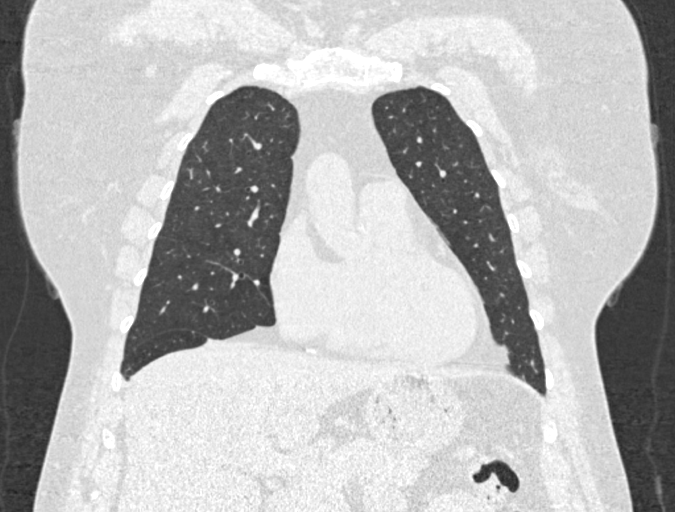
[im 254/424  lung]
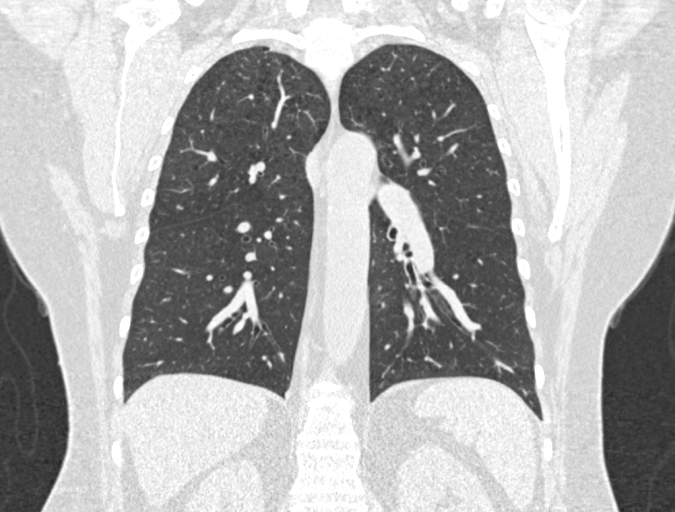

[Series 9: lung 1.00 br60 axial · axial · 0.83mm/px · z∈[-1121,-830]mm · 11 of 321 slices shown, 14 images]
[im 15/321  mediastinal]
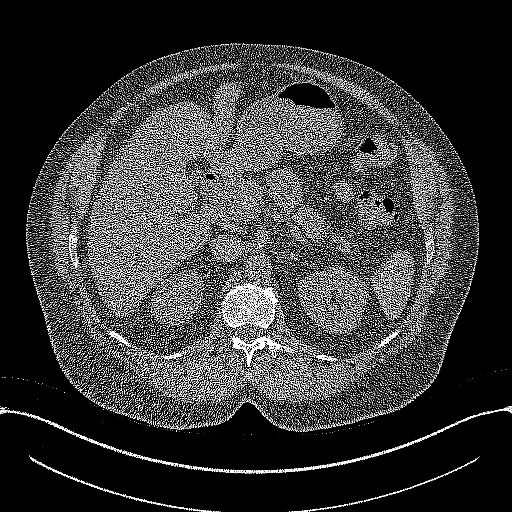
[im 15/321  lung]
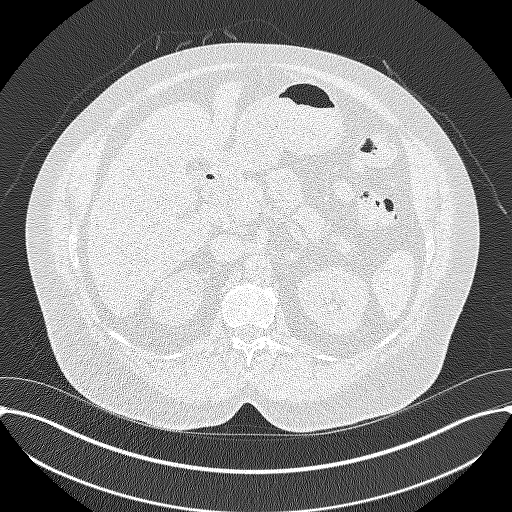
[im 44/321  lung]
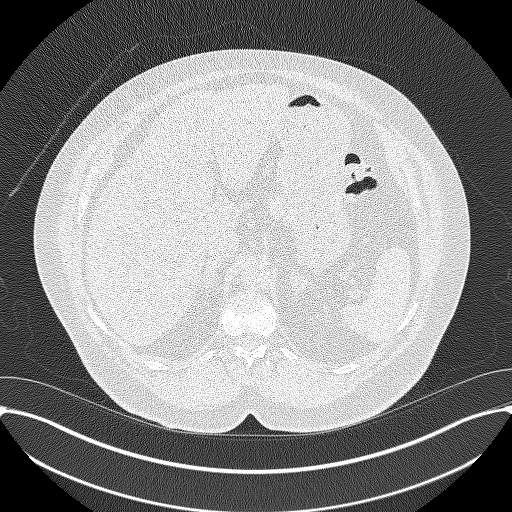
[im 73/321  lung]
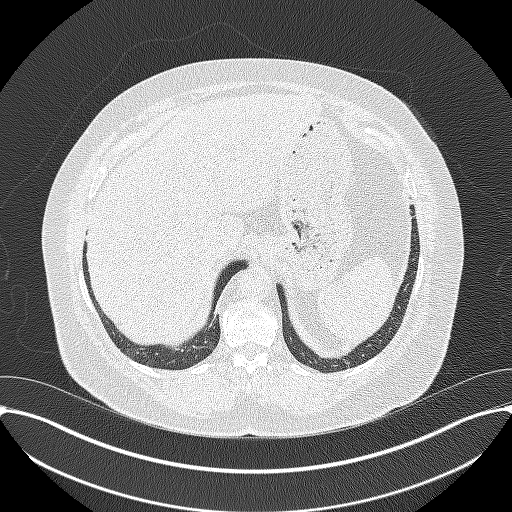
[im 102/321  lung]
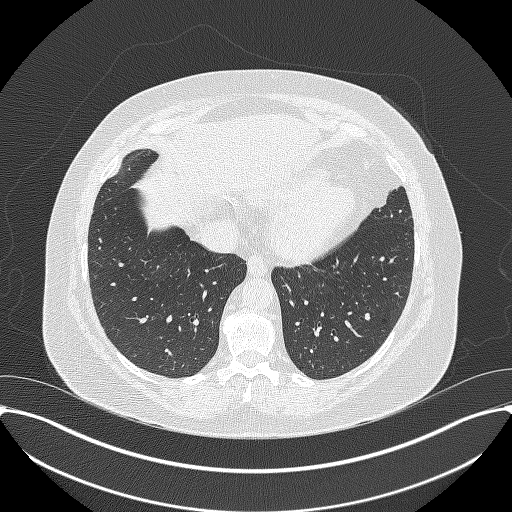
[im 131/321  mediastinal]
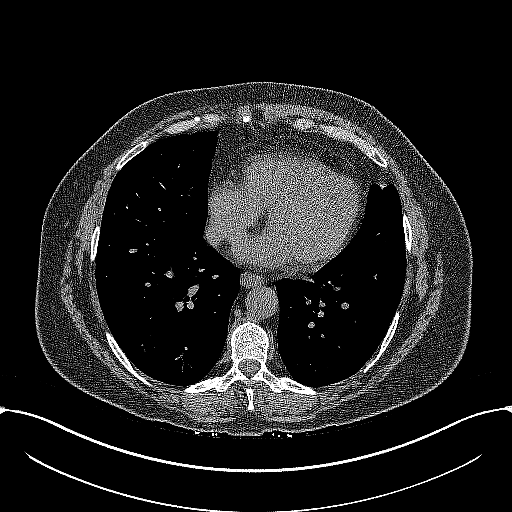
[im 131/321  lung]
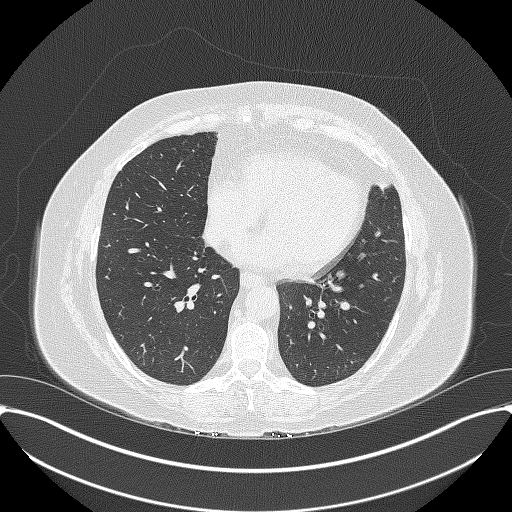
[im 161/321  lung]
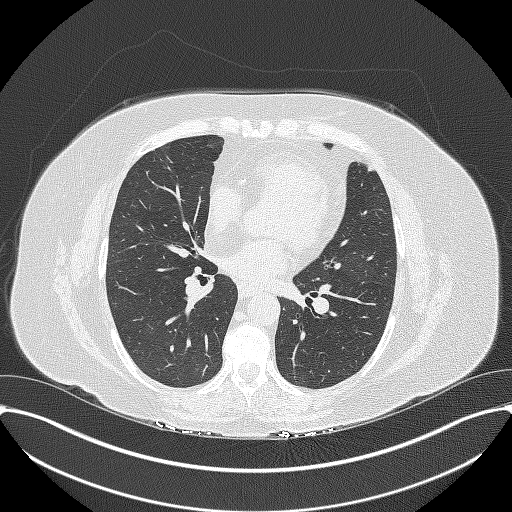
[im 190/321  lung]
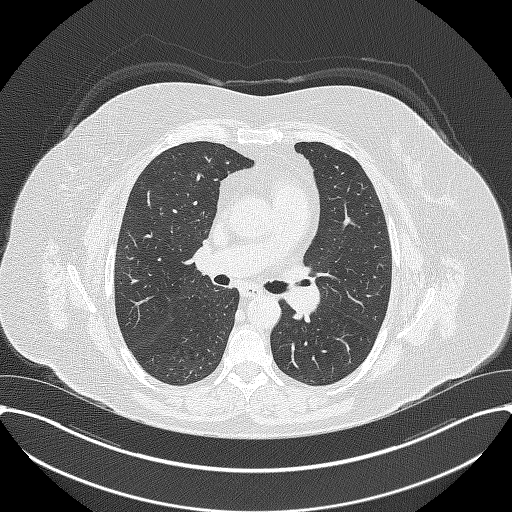
[im 219/321  lung]
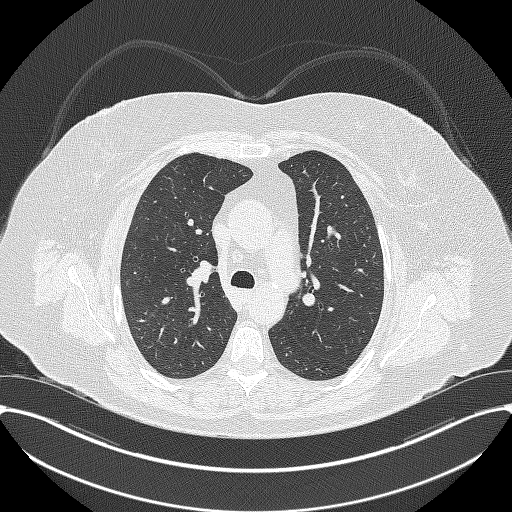
[im 248/321  mediastinal]
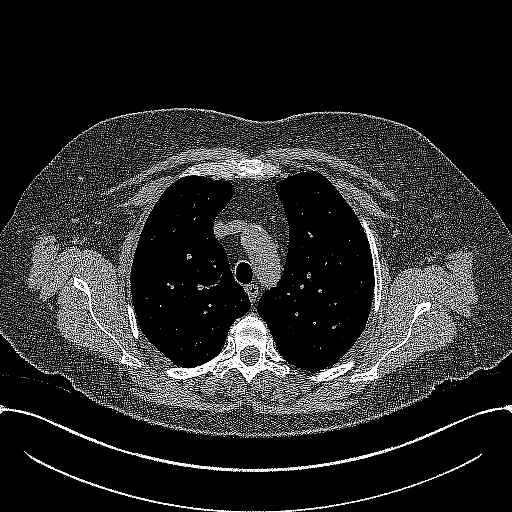
[im 248/321  lung]
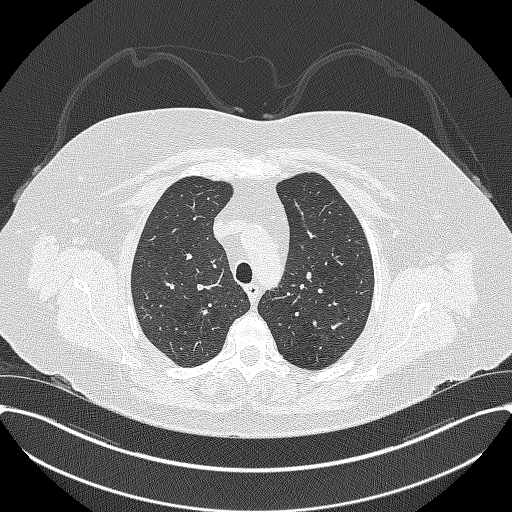
[im 277/321  lung]
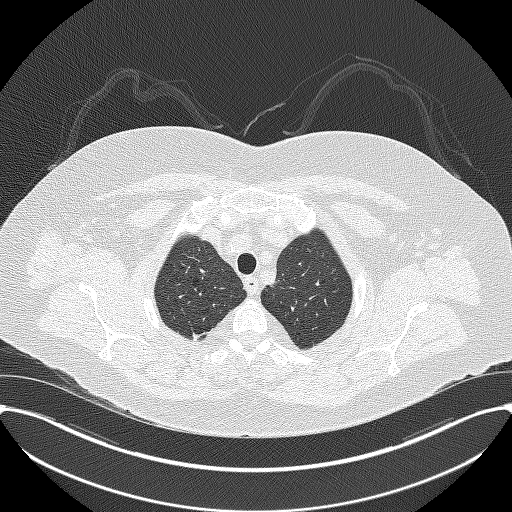
[im 306/321  lung]
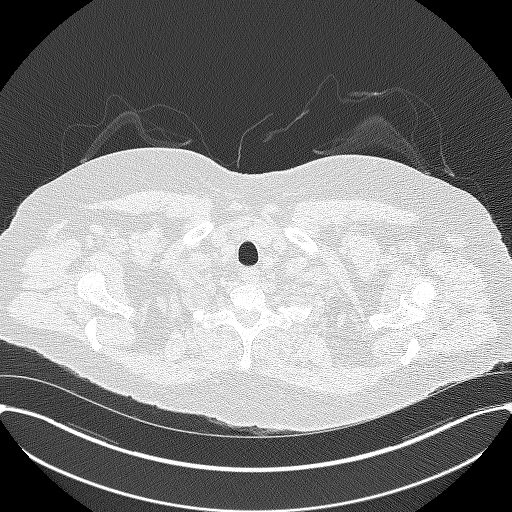

[14 of 40 positions shown; findings below may reference images not displayed]

FINDINGS: Cardiovascular: Normal heart size. Lipomatous hypertrophy of the
interatrial septum. No significant pericardial effusion/thickening.
Three-vessel coronary atherosclerosis. Atherosclerotic nonaneurysmal
thoracic aorta. Normal caliber pulmonary arteries.

Mediastinum/Nodes: No discrete thyroid nodules. Unremarkable
esophagus. No pathologically enlarged axillary, mediastinal or hilar
lymph nodes, noting limited sensitivity for the detection of hilar
adenopathy on this noncontrast study.

Lungs/Pleura: No pneumothorax. No pleural effusion. Mild
centrilobular emphysema with mild diffuse bronchial wall thickening.
No acute consolidative airspace disease or lung masses. Small solid
right lower lobe pulmonary nodule measuring 2.7 mm in volume derived
mean diameter (series 9/image 234). No additional significant
pulmonary nodules.

Upper abdomen: Diffuse hepatic steatosis. Right adrenal 1.2 cm
nodule with density 40 HU.

Musculoskeletal: No aggressive appearing focal osseous lesions.
Moderate thoracic spondylosis.
IMPRESSION: 1. Lung-RADS Category 2, benign appearance or behavior. Continue
annual screening with low-dose chest CT without contrast in 12
months.
2. Three-vessel coronary atherosclerosis.
3. Diffuse hepatic steatosis.
4. Indeterminate 1.2 cm right adrenal nodule. In the absence of a
history of malignancy, this can be presumed to represent adenoma.
Follow-up adrenal protocol CT abdomen without and with IV contrast
may be performed in 12 months. This recommendation follows ACR
consensus guidelines: Management of Incidental Adrenal Masses: A
White Paper of the ACR Incidental Findings Committee. [HOSPITAL] 9497;14:[id number].
5. Aortic Atherosclerosis (2VQ4I-NWI.I) and Emphysema (2VQ4I-3A4.V).

## 2021-04-01 ENCOUNTER — Other Ambulatory Visit: Payer: Self-pay | Admitting: Cardiology

## 2021-04-01 NOTE — Telephone Encounter (Signed)
Refill sent to pharmacy.   

## 2021-04-04 DIAGNOSIS — C4490 Unspecified malignant neoplasm of skin, unspecified: Secondary | ICD-10-CM | POA: Insufficient documentation

## 2021-04-08 DIAGNOSIS — K760 Fatty (change of) liver, not elsewhere classified: Secondary | ICD-10-CM | POA: Insufficient documentation

## 2021-07-18 ENCOUNTER — Other Ambulatory Visit: Payer: Self-pay | Admitting: Cardiology

## 2021-07-31 ENCOUNTER — Other Ambulatory Visit: Payer: Self-pay | Admitting: Internal Medicine

## 2021-08-04 DIAGNOSIS — U071 COVID-19: Secondary | ICD-10-CM

## 2021-08-04 HISTORY — DX: COVID-19: U07.1

## 2021-09-07 ENCOUNTER — Other Ambulatory Visit: Payer: Self-pay

## 2021-09-07 ENCOUNTER — Encounter: Payer: Self-pay | Admitting: Cardiology

## 2021-09-07 ENCOUNTER — Ambulatory Visit: Payer: No Typology Code available for payment source | Admitting: Cardiology

## 2021-09-07 VITALS — BP 124/66 | HR 69 | Ht 64.0 in | Wt 203.0 lb

## 2021-09-07 DIAGNOSIS — J449 Chronic obstructive pulmonary disease, unspecified: Secondary | ICD-10-CM

## 2021-09-07 DIAGNOSIS — R0609 Other forms of dyspnea: Secondary | ICD-10-CM

## 2021-09-07 DIAGNOSIS — G4733 Obstructive sleep apnea (adult) (pediatric): Secondary | ICD-10-CM

## 2021-09-07 DIAGNOSIS — E7800 Pure hypercholesterolemia, unspecified: Secondary | ICD-10-CM | POA: Diagnosis not present

## 2021-09-07 DIAGNOSIS — Z9989 Dependence on other enabling machines and devices: Secondary | ICD-10-CM

## 2021-09-07 NOTE — Patient Instructions (Signed)
Medication Instructions:  Your physician recommends that you continue on your current medications as directed. Please refer to the Current Medication list given to you today.  *If you need a refill on your cardiac medications before your next appointment, please call your pharmacy*   Lab Work: Your physician recommends that you return for lab work in 1 month:Lipid If you have labs (blood work) drawn today and your tests are completely normal, you will receive your results only by: [place] (if you have MyChart) OR A paper copy in the mail If you have any lab test that is abnormal or we need to change your treatment, we will call you to review the results.   Testing/Procedures: None    Follow-Up: At [facility], you and your health needs are our priority.  As part of our continuing mission to provide you with exceptional heart care, we have created designated Provider Care Teams.  These Care Teams include your primary Cardiologist (physician) and Advanced Practice Providers (APPs -  Physician Assistants and Nurse Practitioners) who all work together to provide you with the care you need, when you need it.  We recommend signing up for the patient portal called "MyChart".  Sign up information is provided on this After Visit Summary.  MyChart is used to connect with patients for Virtual Visits (Telemedicine).  Patients are able to view lab/test results, encounter notes, upcoming appointments, etc.  Non-urgent messages can be sent to your provider as well.   To learn more about what you can do with MyChart, go to [email or web address].    Your next appointment:   6 month(s)  The format for your next appointment:   In Person  Provider:   Jenne Campus, MD   Other Instructions

## 2021-09-07 NOTE — Addendum Note (Signed)
Addended by: Senaida Ores on: 09/07/2021 08:53 AM   Modules accepted: Orders

## 2021-09-07 NOTE — Progress Notes (Signed)
Cardiology Office Note:    Date:  09/07/2021   ID:  Jennifer Werner, DOB 04/21/1961, MRN [id number]  PCP:  [name], PA-C  Cardiologist:  Jenne Campus, MD    Referring MD: [name], PA-C   Chief Complaint  Patient presents with   Follow-up  I am doing fine but have COVID  History of Present Illness:    Jennifer Werner is a 60 y.o. female with past medical history significant for COPD, which is quite advanced, history of smoking, obstructive sleep apnea, essential hypertension, dyslipidemia, she was evaluated by me because of shortness of breath so far cardiac work-up being unrevealing looks like everything directed still work Condition is a source of her dyspnea on exertion.  Recently she ended up having COVID last month she was sick but recovered quite nicely and overall seems to be doing well.  Past Medical History:  Diagnosis Date   Allergy    Anemia    Arthritis    Cancer ([place])    skin    Chronic venous insufficiency 05/29/2018   Cigarette smoker 04/02/2018   COPD (chronic obstructive pulmonary disease) ([place])    COPD GOLD  II  04/09/2018   Quit smoking 05/2018  05/21/2018  Walked RA x 3 laps @ 185 ft each stopped due to  End of study, fast pace, no desat but sob at end of study   Spirometry 05/21/2018  FEV1 1.22 (45%)  Ratio 58 p am advair 500 PFT's  08/22/2018  FEV1 1.50 (56 % ) ratio 65  p 20 % improvement from saba p symb 160 x2  prior to study with DLCO  72 % corrects to 90 % for alv volume   - 12/06/2018   try bevespi 2 bid if insura   COVID-19 08/04/2021   Dyspnea on exertion 04/09/2018   Eczema    Elevated AST (SGOT) 08/25/2019   Elevated LDL cholesterol level 04/06/2020   Equinus deformity of foot, acquired 03/04/2014   Former smoker 04/02/2018   Formatting of this note might be different from the original. Last Assessment & Plan:  4-5 min discussion re active cigarette smoking in addition to office E&M  Ask about tobacco use:  Ongoing, down to 10 per day  Advise quitting:    I emphasized that although we never turn away smokers from the pulmonary clinic, we do ask that they understand that the recommendations that we make  won't work nearl   GERD (gastroesophageal reflux disease)    Hidradenitis suppurativa 01/05/2020   Insulin resistance 08/25/2019   Metatarsal deformity 03/04/2014   Morbid obesity complicated by osa/aodm 93/57/0177   Obesity, Class I, BMI 30-34.9 04/05/2020   Obstructive sleep apnea on CPAP 09/09/2018   wears CPAP nightly   Pedal edema 08/25/2019   Plantar fasciitis of left foot 03/04/2014   Right carpal tunnel syndrome 10/14/2018   Sebaceous cyst 11/05/2018   Sleep apnea    wears CPAP nightly   Smoker    Varicose veins of bilateral lower extremities with other complications 93/90/3009    Past Surgical History:  Procedure Laterality Date   ABLATION     Uterus   COLONOSCOPY  09/20/2018   ENDOVENOUS ABLATION SAPHENOUS VEIN W/ LASER Left 09/25/2018   endovenous laser ablation left greater saphenous vein by Ruta Hinds MD    EXCISION MASS HEAD Right 12/02/2018   Procedure: EXCISION RIGHT PERIORBITAL SEBACEOUS CYST;  Surgeon: Wallace Going, DO;  Location: [facility];  Service:  Plastics;  Laterality: Right;   POLYPECTOMY      Current Medications: Current Meds  Medication Sig   albuterol (PROAIR HFA) 108 (90 Base) MCG/ACT inhaler 2 puffs every 4 hours as needed only  if your can't catch your breath (Patient taking differently: Inhale 2 puffs into the lungs every 6 (six) hours as needed for wheezing or shortness of breath. 2 puffs every 4 hours as needed only  if your can't catch your breath)   ascorbic acid (VITAMIN C) 1000 MG tablet Take 1,000 mg by mouth daily.   aspirin EC 81 MG tablet Take 81 mg by mouth daily.   atorvastatin (LIPITOR) 10 MG tablet TAKE 1 TABLET BY MOUTH EVERY DAY (Patient taking differently: Take 10 mg by mouth daily.)   budesonide-formoterol (SYMBICORT) 160-4.5 MCG/ACT  inhaler INHALE 2 PUFFS FIRST THING IN THE MORNING THEN ANOTHER 2 PUFFS 12 HOURS LATER (Patient taking differently: Inhale 2 puffs into the lungs in the morning and at bedtime. INHALE 2 PUFFS FIRST THING IN THE MORNING THEN ANOTHER 2 PUFFS 12 HOURS LATER)   Cholecalciferol (VITAMIN D3) 1.25 MG (50000 UT) CAPS Take 30,000 Units by mouth once a week.   furosemide (LASIX) 40 MG tablet TAKE 1 TABLET BY MOUTH EVERY DAY (Patient taking differently: Take 40 mg by mouth daily.)   gabapentin (NEURONTIN) 100 MG capsule Take 100 mg by mouth 3 (three) times daily.   ibuprofen (ADVIL,MOTRIN) 200 MG tablet Take 400 mg by mouth as needed for mild pain, moderate pain or headache.   KLOR-CON M10 10 MEQ tablet TAKE 1 TABLET BY MOUTH EVERY DAY (Patient taking differently: Take 10 mEq by mouth daily.)   metFORMIN (GLUCOPHAGE) 500 MG tablet Take 500 mg by mouth 2 (two) times daily.    Multiple Vitamin (MULTIVITAMIN) tablet Take 1 tablet by mouth daily. Unknown strength   Omega-3 Fatty Acids (FISH OIL) 1000 MG CAPS Take 1 capsule by mouth daily.   SPIRIVA RESPIMAT 2.5 MCG/ACT AERS INHALE 2 PUFFS BY MOUTH INTO THE LUNGS DAILY (Patient taking differently: Inhale 2 puffs into the lungs daily.)   tiZANidine (ZANAFLEX) 4 MG tablet Take 4 mg by mouth 3 (three) times daily as needed for muscle spasms.     Allergies:   Latex and Codeine   Social History   Socioeconomic History   Marital status: Married    Spouse name: Not on file   Number of children: Not on file   Years of education: Not on file   Highest education level: Not on file  Occupational History   Not on file  Tobacco Use   Smoking status: Some Days    Packs/day: 0.25    Years: 47.00    Pack years: 11.75    Types: Cigarettes    Last attempt to quit: 05/31/2018    Years since quitting: 3.2   Smokeless tobacco: Never  Vaping Use   Vaping Use: Never used  Substance and Sexual Activity   Alcohol use: Yes    Alcohol/week: 6.0 standard drinks    Types: 6  Standard drinks or equivalent per week   Drug use: Never   Sexual activity: Yes    Birth control/protection: Surgical  Other Topics Concern   Not on file  Social History Narrative   RN - desk work mainly   Lives with husband   Social Determinants of Health   Financial Resource Strain: Not on file  Food Insecurity: Not on file  Transportation Needs: Not on file  Physical Activity:  Not on file  Stress: Not on file  Social Connections: Not on file     Family History: The patient's family history includes Alcohol abuse in her brother and father; Asthma in her brother; Cardiomyopathy in her brother, father, paternal aunt, and paternal uncle; Congestive Heart Failure in her mother; Heart block in her mother; Lymphoma in her mother; Rheum arthritis in her paternal grandmother. There is no history of Colon cancer, Esophageal cancer, Rectal cancer, Stomach cancer, or Colon polyps. ROS:   Please see the history of present illness.    All 14 point review of systems negative except as described per history of present illness  EKGs/Labs/Other Studies Reviewed:      Recent Labs: 11/29/2020: ALT 37  Recent Lipid Panel    Component Value Date/Time   CHOL 236 (H) 11/29/2020 0925   TRIG 152 (H) 11/29/2020 0925   HDL 48 11/29/2020 0925   CHOLHDL 4.9 (H) 11/29/2020 0925   LDLCALC 160 (H) 11/29/2020 0925    Physical Exam:    VS:  BP 124/66 (BP Location: Right Arm, Patient Position: Sitting)   Pulse 69   Ht 5\' 4"  (1.626 m)   Wt 203 lb (92.1 kg)   SpO2 93%   BMI 34.84 kg/m     Wt Readings from Last 3 Encounters:  09/07/21 203 lb (92.1 kg)  11/29/20 196 lb (88.9 kg)  10/13/20 191 lb 12.8 oz (87 kg)     GEN:  Well nourished, well developed in no acute distress HEENT: Normal NECK: No JVD; No carotid bruits LYMPHATICS: No lymphadenopathy CARDIAC: RRR, no murmurs, no rubs, no gallops RESPIRATORY:  Clear to auscultation without rales, wheezing or rhonchi  ABDOMEN: Soft, non-tender,  non-distended MUSCULOSKELETAL:  No edema; No deformity  SKIN: Warm and dry LOWER EXTREMITIES: no swelling NEUROLOGIC:  Alert and oriented x 3 PSYCHIATRIC:  Normal affect   ASSESSMENT:    1. Dyspnea on exertion   2. Elevated LDL cholesterol level   3. COPD GOLD  II    4. Obstructive sleep apnea on CPAP    PLAN:    In order of problems listed above:  Dyspnea on exertion multifactorial but COPD clinic play significant role here.  Sadly she still continues to smoke on top of that recently she had COVID seems to be recovering still coughing some symptoms still some shortness of breath denies have any chest pain tightness squeezing pressure burning chest, last echocardiogram revealed preserved left ventricle ejection fraction. Elevated LDL did review her K PN which show me her LDL of 160 HDL 48 however that was done in November 29, 2020.  In March of this year she had repeated cholesterol which showed LDL of 90.  We will schedule her to have another fasting lipid profile done but I want her to wait another month before doing the test. Smoking obviously huge problem we spent at least 5 minutes talking about it and I strongly recommended her to quit.  Essential hypertension blood pressure seems to be well controlled   Medication Adjustments/Labs and Tests Ordered: Current medicines are reviewed at length with the patient today.  Concerns regarding medicines are outlined above.  No orders of the defined types were placed in this encounter.  Medication changes: No orders of the defined types were placed in this encounter.   Signed, Park Liter, MD, [facility] 09/07/2021 8:47 AM    [place]

## 2021-09-24 ENCOUNTER — Other Ambulatory Visit: Payer: Self-pay | Admitting: Cardiology

## 2021-10-01 ENCOUNTER — Other Ambulatory Visit: Payer: Self-pay | Admitting: Internal Medicine

## 2021-10-07 ENCOUNTER — Telehealth: Payer: Self-pay | Admitting: Internal Medicine

## 2021-10-08 MED ORDER — BUDESONIDE-FORMOTEROL FUMARATE 160-4.5 MCG/ACT IN AERO: INHALATION_SPRAY | RESPIRATORY_TRACT | 1 refills | Status: DC

## 2021-10-08 MED ORDER — SPIRIVA RESPIMAT 2.5 MCG/ACT IN AERS: 2.0000 | INHALATION_SPRAY | Freq: Every day | RESPIRATORY_TRACT | 1 refills | Status: DC

## 2021-10-08 NOTE — Telephone Encounter (Signed)
I have called the pt and she stated that she was just seen by MW back on the 28th.  I did not see this in her chart.  She has been scheduled for appt with MW 1/23.  Refills have been sent to the pharmacy and the pt is aware.

## 2021-10-17 NOTE — Progress Notes (Signed)
PATIENT: Jennifer Werner DOB: 1961-11-02  REASON FOR VISIT: follow up HISTORY FROM: patient PRIMARY NEUROLOGIST:   Virtual Visit via Video Note  I connected with Harrel Lemon on 10/18/21 at  2:30 PM EST by a video enabled telemedicine application located remotely at [facility] and verified that I am speaking with the correct person using two identifiers who was located at their own home.   I discussed the limitations of evaluation and management by telemedicine and the availability of in person appointments. The patient expressed understanding and agreed to proceed.   PATIENT: Jennifer Werner DOB: 06-24-1961  REASON FOR VISIT: follow up HISTORY FROM: patient  HISTORY OF PRESENT ILLNESS: Today 10/18/21:  [name] is a 60 year old female with a history of obstructive sleep apnea on CPAP.  She returns today for follow-up.  She reports that the CPAP is working well for her.  She denies any new issues.  Her download is below    HISTORY 10/13/20:   [name] is a 60 year old female with a history of obstructive sleep apnea on CPAP.  Her download indicates that she use her machine nightly for compliance of 100%.  She used her machine greater than 4 hours each night.  On average she uses her machine 8 hours and 29 minutes.  Her residual AHI is 0.9 on 14 cm of water.  Leak in the 95th percentile is 26.6 L/min.  Overall she is doing well.  She returns today for evaluation   REVIEW OF SYSTEMS: Out of a complete 14 system review of symptoms, the patient complains only of the following symptoms, and all other reviewed systems are negative.  ALLERGIES: Allergies  Allergen Reactions   Latex Rash   Codeine     HOME MEDICATIONS: Outpatient Medications Prior to Visit  Medication Sig Dispense Refill   albuterol (PROAIR HFA) 108 (90 Base) MCG/ACT inhaler 2 puffs every 4 hours as needed only  if your can't catch your breath (Patient taking differently: Inhale 2  puffs into the lungs every 6 (six) hours as needed for wheezing or shortness of breath. 2 puffs every 4 hours as needed only  if your can't catch your breath) 1 Inhaler 1   ascorbic acid (VITAMIN C) 1000 MG tablet Take 1,000 mg by mouth daily.     aspirin EC 81 MG tablet Take 81 mg by mouth daily.     atorvastatin (LIPITOR) 10 MG tablet TAKE 1 TABLET BY MOUTH EVERY DAY (Patient taking differently: Take 10 mg by mouth daily.) 90 tablet 1   budesonide-formoterol (SYMBICORT) 160-4.5 MCG/ACT inhaler INHALE 2 PUFFS FIRST THING IN THE MORNING THEN ANOTHER 2 PUFFS 12 HOURS LATER 30.6 g 1   Cholecalciferol (VITAMIN D3) 1.25 MG (50000 UT) CAPS Take 30,000 Units by mouth once a week.     furosemide (LASIX) 40 MG tablet TAKE 1 TABLET BY MOUTH EVERY DAY 90 tablet 3   gabapentin (NEURONTIN) 100 MG capsule Take 100 mg by mouth 3 (three) times daily.     ibuprofen (ADVIL,MOTRIN) 200 MG tablet Take 400 mg by mouth as needed for mild pain, moderate pain or headache.     KLOR-CON M10 10 MEQ tablet TAKE 1 TABLET BY MOUTH EVERY DAY 90 tablet 3   metFORMIN (GLUCOPHAGE) 500 MG tablet Take 500 mg by mouth 2 (two) times daily.      Multiple Vitamin (MULTIVITAMIN) tablet Take 1 tablet by mouth daily. Unknown strength     Omega-3 Fatty Acids (  FISH OIL) 1000 MG CAPS Take 1 capsule by mouth daily.     SPIRIVA RESPIMAT 2.5 MCG/ACT AERS Inhale 2 puffs into the lungs daily. INHALE 2 PUFFS BY MOUTH INTO THE LUNGS DAILY 12 g 1   tiZANidine (ZANAFLEX) 4 MG tablet Take 4 mg by mouth 3 (three) times daily as needed for muscle spasms.     No facility-administered medications prior to visit.    PAST MEDICAL HISTORY: Past Medical History:  Diagnosis Date   Allergy    Anemia    Arthritis    Cancer ([place])    skin    Chronic venous insufficiency 05/29/2018   Cigarette smoker 04/02/2018   COPD (chronic obstructive pulmonary disease) ([place])    COPD GOLD  II  04/09/2018   Quit smoking 05/2018  05/21/2018  Walked RA x 3 laps @ 185 ft  each stopped due to  End of study, fast pace, no desat but sob at end of study   Spirometry 05/21/2018  FEV1 1.22 (45%)  Ratio 58 p am advair 500 PFT's  08/22/2018  FEV1 1.50 (56 % ) ratio 65  p 20 % improvement from saba p symb 160 x2  prior to study with DLCO  72 % corrects to 90 % for alv volume   - 12/06/2018   try bevespi 2 bid if insura   COVID-19 08/04/2021   Dyspnea on exertion 04/09/2018   Eczema    Elevated AST (SGOT) 08/25/2019   Elevated LDL cholesterol level 04/06/2020   Equinus deformity of foot, acquired 03/04/2014   Former smoker 04/02/2018   Formatting of this note might be different from the original. Last Assessment & Plan:  4-5 min discussion re active cigarette smoking in addition to office E&M  Ask about tobacco use:  Ongoing, down to 10 per day Advise quitting:    I emphasized that although we never turn away smokers from the pulmonary clinic, we do ask that they understand that the recommendations that we make  won't work nearl   GERD (gastroesophageal reflux disease)    Hidradenitis suppurativa 01/05/2020   Insulin resistance 08/25/2019   Metatarsal deformity 03/04/2014   Morbid obesity complicated by osa/aodm 85/88/5027   Obesity, Class I, BMI 30-34.9 04/05/2020   Obstructive sleep apnea on CPAP 09/09/2018   wears CPAP nightly   Pedal edema 08/25/2019   Plantar fasciitis of left foot 03/04/2014   Right carpal tunnel syndrome 10/14/2018   Sebaceous cyst 11/05/2018   Sleep apnea    wears CPAP nightly   Smoker    Varicose veins of bilateral lower extremities with other complications 74/11/8785    PAST SURGICAL HISTORY: Past Surgical History:  Procedure Laterality Date   ABLATION     Uterus   COLONOSCOPY  09/20/2018   ENDOVENOUS ABLATION SAPHENOUS VEIN W/ LASER Left 09/25/2018   endovenous laser ablation left greater saphenous vein by Ruta Hinds MD    EXCISION MASS HEAD Right 12/02/2018   Procedure: EXCISION RIGHT PERIORBITAL SEBACEOUS CYST;  Surgeon:  Wallace Going, DO;  Location: [place];  Service: Plastics;  Laterality: Right;   POLYPECTOMY      FAMILY HISTORY: Family History  Problem Relation Age of Onset   Heart block Mother    Lymphoma Mother    Congestive Heart Failure Mother    Cardiomyopathy Father    Alcohol abuse Father    Cardiomyopathy Brother        Had Heart Transplant   Alcohol abuse Brother  Asthma Brother    Cardiomyopathy Paternal Aunt    Cardiomyopathy Paternal Uncle    Rheum arthritis Paternal Grandmother    Colon cancer Neg Hx    Esophageal cancer Neg Hx    Rectal cancer Neg Hx    Stomach cancer Neg Hx    Colon polyps Neg Hx     SOCIAL HISTORY: Social History   Socioeconomic History   Marital status: Married    Spouse name: Not on file   Number of children: Not on file   Years of education: Not on file   Highest education level: Not on file  Occupational History   Not on file  Tobacco Use   Smoking status: Some Days    Packs/day: 0.25    Years: 47.00    Pack years: 11.75    Types: Cigarettes    Last attempt to quit: 05/31/2018    Years since quitting: 3.3   Smokeless tobacco: Never  Vaping Use   Vaping Use: Never used  Substance and Sexual Activity   Alcohol use: Yes    Alcohol/week: 6.0 standard drinks    Types: 6 Standard drinks or equivalent per week   Drug use: Never   Sexual activity: Yes    Birth control/protection: Surgical  Other Topics Concern   Not on file  Social History Narrative   RN - desk work mainly   Lives with husband   Social Determinants of Radio broadcast assistant Strain: Not on file  Food Insecurity: Not on file  Transportation Needs: Not on file  Physical Activity: Not on file  Stress: Not on file  Social Connections: Not on file  Intimate Partner Violence: Not on file      PHYSICAL EXAM Generalized: Well developed, in no acute distress   Neurological examination  Mentation: Alert oriented to time, place, history  taking. Follows all commands speech and language fluent Cranial nerve II-XII:Extraocular movements were full. Facial symmetry noted.Marland Kitchen Head turning and shoulder shrug  were normal and symmetric.  Reflexes: UTA  DIAGNOSTIC DATA (LABS, IMAGING, TESTING) - I reviewed patient records, labs, notes, testing and imaging myself where available.  Lab Results  Component Value Date   WBC 8.3 04/02/2018   HGB 16.6 (H) 04/02/2018   HCT 50.3 (H) 04/02/2018   MCV 88 04/02/2018   PLT 260 04/02/2018      Component Value Date/Time   NA 141 11/28/2018 1226   NA 140 08/26/2018 1210   K 3.8 11/28/2018 1226   CL 102 11/28/2018 1226   CO2 29 11/28/2018 1226   GLUCOSE 121 (H) 11/28/2018 1226   BUN 10 11/28/2018 1226   BUN 15 08/26/2018 1210   CREATININE 0.95 11/28/2018 1226   CREATININE 0.70 06/26/2012 1505   CALCIUM 9.8 11/28/2018 1226   PROT 7.2 06/21/2018 1648   ALBUMIN 4.5 06/21/2018 1648   AST 23 11/29/2020 0925   ALT 37 (H) 11/29/2020 0925   ALKPHOS 74 06/21/2018 1648   BILITOT <0.2 06/21/2018 1648   GFRNONAA >60 11/28/2018 1226   GFRAA >60 11/28/2018 1226   Lab Results  Component Value Date   CHOL 236 (H) 11/29/2020   HDL 48 11/29/2020   LDLCALC 160 (H) 11/29/2020   TRIG 152 (H) 11/29/2020   CHOLHDL 4.9 (H) 11/29/2020   Lab Results  Component Value Date   HGBA1C 5.9 (H) 04/02/2018   No results found for: [id number] Lab Results  Component Value Date   TSH 0.886 04/02/2018      ASSESSMENT  AND PLAN 60 y.o. year old female  has a past medical history of Allergy, Anemia, Arthritis, Cancer ([place]), Chronic venous insufficiency (05/29/2018), Cigarette smoker (04/02/2018), COPD (chronic obstructive pulmonary disease) ([place]), COPD GOLD  II  (04/09/2018), COVID-19 (08/04/2021), Dyspnea on exertion (04/09/2018), Eczema, Elevated AST (SGOT) (08/25/2019), Elevated LDL cholesterol level (04/06/2020), Equinus deformity of foot, acquired (03/04/2014), Former smoker (04/02/2018), GERD  (gastroesophageal reflux disease), Hidradenitis suppurativa (01/05/2020), Insulin resistance (08/25/2019), Metatarsal deformity (03/04/2014), Morbid obesity complicated by osa/aodm (12/07/2018), Obesity, Class I, BMI 30-34.9 (04/05/2020), Obstructive sleep apnea on CPAP (09/09/2018), Pedal edema (08/25/2019), Plantar fasciitis of left foot (03/04/2014), Right carpal tunnel syndrome (10/14/2018), Sebaceous cyst (11/05/2018), Sleep apnea, Smoker, and Varicose veins of bilateral lower extremities with other complications (83/66/2947). here with:  OSA on CPAP  CPAP compliance excellent Residual AHI is good Encouraged patient to continue using CPAP nightly and > 4 hours each night F/U in 1 year or sooner if needed   Ward Givens, MSN, NP-C 10/18/2021, 2:24 PM [facility] [address], [place], [place] [address] [phone]

## 2021-10-18 ENCOUNTER — Telehealth (INDEPENDENT_AMBULATORY_CARE_PROVIDER_SITE_OTHER): Payer: No Typology Code available for payment source | Admitting: Adult Health

## 2021-10-18 DIAGNOSIS — G4733 Obstructive sleep apnea (adult) (pediatric): Secondary | ICD-10-CM

## 2021-10-18 DIAGNOSIS — Z9989 Dependence on other enabling machines and devices: Secondary | ICD-10-CM

## 2021-12-06 ENCOUNTER — Encounter: Payer: Self-pay | Admitting: Internal Medicine

## 2021-12-06 ENCOUNTER — Ambulatory Visit: Payer: No Typology Code available for payment source | Admitting: Internal Medicine

## 2021-12-06 ENCOUNTER — Other Ambulatory Visit: Payer: Self-pay

## 2021-12-06 DIAGNOSIS — F17210 Nicotine dependence, cigarettes, uncomplicated: Secondary | ICD-10-CM

## 2021-12-06 DIAGNOSIS — J449 Chronic obstructive pulmonary disease, unspecified: Secondary | ICD-10-CM

## 2021-12-06 MED ORDER — SPIRIVA RESPIMAT 2.5 MCG/ACT IN AERS: 2.0000 | INHALATION_SPRAY | Freq: Every day | RESPIRATORY_TRACT | 11 refills | Status: DC

## 2021-12-06 MED ORDER — ALBUTEROL SULFATE HFA 108 (90 BASE) MCG/ACT IN AERS: INHALATION_SPRAY | RESPIRATORY_TRACT | 5 refills | Status: DC

## 2021-12-06 MED ORDER — BUDESONIDE-FORMOTEROL FUMARATE 160-4.5 MCG/ACT IN AERO: INHALATION_SPRAY | RESPIRATORY_TRACT | 11 refills | Status: DC

## 2021-12-06 NOTE — Patient Instructions (Addendum)
No change in medications  Keep appt for annual lung cancer screening   The key is to stop smoking completely before smoking completely stops you!    Please schedule a follow up visit in 12 months but call sooner if needed

## 2021-12-06 NOTE — Assessment & Plan Note (Signed)
Counseled re importance of smoking cessation but did not meet time criteria for separate billing    Low-dose CT lung cancer screening is recommended for patients who are 34-62 years of age with a 20+ pack-year history of smoking, and who are currently smoking or quit <=15 years ago. No coughing up blood  No unintentional weight loss of > 15 pounds in the last 6 months  >>> still eligible so rec continue annual screening per PCP          Each maintenance medication was reviewed in detail including emphasizing most importantly the difference between maintenance and prns and under what circumstances the prns are to be triggered using an action plan format where appropriate.  Total time for H and P, chart review, counseling, reviewing hfa device(s) and generating customized AVS unique to this office visit / same day charting = 25 min

## 2021-12-06 NOTE — Assessment & Plan Note (Addendum)
Active smoker  05/21/2018  Walked RA x 3 laps @ 185 ft each stopped due to  End of study, fast pace, no desat but sob at end of study   Spirometry 05/21/2018  FEV1 1.22 (45%)  Ratio 58 p am advair 500 PFT's  08/22/2018  FEV1 1.50 (56 % ) ratio 65  p 20 % improvement from saba p symb 160 x2  prior to study with DLCO  72 % corrects to 90 % for alv volume   - 12/06/2018   try bevespi 2 bid if insurance covers> preferred symb/spiriva  - 01/09/2023  After extensive coaching inhaler device,  effectiveness =   80% short ti         Group D in terms of symptom/risk and laba/lama/ICS  therefore appropriate rx at this point so will add lama to see if notes improved ex tol (her goal) and emphasized avoid any smoking at all costs   Also needs to change from combivent to saba prn to prevent excess anticholinergic side effects from using sama with saba.   I had an extended discussion with the patient reviewing all relevant studies completed to date and  lasting 15 to 20 minutes of a 25 minute visit    See device teaching which extended face to face time for this visit.  Each maintenance medication was reviewed in detail including emphasizing most importantly the difference between maintenance and prns and under what circumstances the prns are to be triggered using an action plan format that is not reflected in the computer generated alphabetically organized AVS which I have not found useful in most complex patients, especially with respiratory illnesses  Please see AVS for specific instructions unique to this visit that I personally wrote and verbalized to the the pt in detail and then reviewed with pt  by my nurse highlighting any  changes in therapy recommended at today's visit to their plan of care.  Quit smoking 05/2018  05/21/2018  Walked RA x 3 laps @ 185 ft each stopped due to  End of study, fast pace, no desat but sob at end of study   Spirometry 05/21/2018  FEV1 1.22 (45%)  Ratio 58 p am advair 500  PFT's   08/22/2018  FEV1 1.50 (56 % ) ratio 65  p 20 % improvement from saba p symb 160 x2  prior to study with DLCO  72 % corrects to 90 % for alv volume   - 12/06/2018  After extensive coaching inhaler device,  effectiveness =    75% > try bevespi 2 bid if insurance covers  Last Assessment & Plan:  Quit smoking 05/2018  05/21/2018  Walked RA x 3 laps @ 185 ft each stopped due to  End of study, fast pace, no desat but sob at end of study   Spirometry 05/21/2018  FEV1 1.22 (45%)  Ratio 58 p am advair 500  PFT's  08/22/2018  FEV1 1.50 (56 % ) ratio 65  p 20 % improvement from saba p symb 160 x2  prior to study with DLCO  72 % corrects to 90 % for alv volume   - 12/06/2018  After extensive coaching inhaler device,  effectiveness =    75% > try bevespi 2 bid if insurance covers  >>> consider LDSCT see avs for instructions unique to this ov  >>> f/u q 6 m, sooner prn   S/p smoking cessation 05/2018, now Pt is Group B in terms of symptom/risk and laba/lama therefore appropriate  rx at this point so reasaonable to try bevespi 2 bid keeping all her inhalers hfa.   She is familiar with respimat device also so if Bevespi not covered ok to change to stiolto  Advised : formulary restrictions will be an ongoing challenge for the forseable future and I would be happy to pick an alternative if the pt will first  provide me a list of them but pt  will need to return here for training for any new device that is required eg dpi vs hfa vs respimat.    In meantime we can always provide samples so the patient never runs out of any needed respiratory medications.  Quit smoking 05/2018  05/21/2018  Walked RA x 3 laps @ 185 ft each stopped due to  End of study, fast pace, no desat but sob at end of study   Spirometry 05/21/2018  FEV1 1.22 (45%)  Ratio 58 p am advair 500 PFT's  08/22/2018  FEV1 1.50 (56 % ) ratio 65  p 20 % improvement from saba p symb 160 x2  prior to study with DLCO  72 % corrects to 90 % for alv volume   - 12/06/2018    try bevespi 2 bid if insurance covers> preferred symb/spiriva  - 06/09/2019  After extensive coaching inhaler device,  effectiveness =    75% (short ti)   Last Assessment & Plan:  Quit smoking 05/2018  05/21/2018  Walked RA x 3 laps @ 185 ft each stopped due to  End of study, fast pace, no desat but sob at end of study   Spirometry 05/21/2018  FEV1 1.22 (45%)  Ratio 58 p am advair 500 PFT's  08/22/2018  FEV1 1.50 (56 % ) ratio 65  p 20 % improvement from saba p symb 160 x2  prior to study with DLCO  72 % corrects to 90 % for alv volume   - 12/06/2018   try bevespi 2 bid if insurance covers> preferred symb/spiriva  - 06/09/2019  After extensive coaching inhaler device,  effectiveness =    75% (short ti)    Group D in terms of symptom/risk and laba/lama/ICS  therefore appropriate rx at this point >>>  Continue symb/spiriva and try to stay as active as possible   F/u yearly, call sooner if needed  I had an extended discussion with the patient reviewing all relevant studies completed to date and  lasting 15 to 20 minutes of a 25 minute visit    See device teaching which extended face to face time for this visit.  Each maintenance medication was reviewed in detail including emphasizing most importantly the difference between maintenance and prns and under what circumstances the prns are to be triggered using an action plan format that is not reflected in the computer generated alphabetically organized AVS which I have not found useful in most complex patients, especially with respiratory illnesses  Please see AVS for specific instructions unique to this visit that I personally wrote and verbalized to the the pt in detail and then reviewed with pt  by my nurse highlighting any  changes in therapy recommended at today's visit to their plan of care.

## 2021-12-06 NOTE — Progress Notes (Signed)
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 worse in winter with baseline 120 lf then doe x 2017 worse since winter of 2019 rx with advair 500 dose /combivent and prednisone > 50% back to baseline so referred to pulmonary clinic 05/21/2018 by Windell Hummingbird.   Sleep study 05/15/18 c/w AHI 8 but desats > plan cpap titration byDr Rexene Alberts    05/21/2018 1st [facility] office visit/ Jennifer Werner   Chief Complaint  Patient presents with   Pulmonary Consult    Referred by Windell Hummingbird, PA for eval of hypoxia.  She states she has been having SOB since Winter 2017. She states she presented to her PCP with SOB in April 2019 and had o2 sat of 88%ra. She states she gets SOB walking for approx 5-10 min.  She has occ cough, prod in the am after inhaler with tan sputum.    sleeps horizontal rotated on L side wakes up each am feeling tired / hits snooze button twice /some am cough/ congestion and then uses combivent x one and w/in 15 min better then later on in the am finally gets around to taking the advair 500. Breathing worse in hot weather. 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 Plan A = Automatic = Symbicort 160 Take 2 puffs first thing in am and then another 2 puffs about 12 hours later.  Work on inhaler technique:  relax and gently blow all the way out then take a nice smooth deep breath back in, triggering the inhaler at same time you start breathing in.  Hold for up to 5 seconds if you can. Blow out thru nose. 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 She is using her proair inhaler 1-2 x per wk.  Dyspnea:  MMRC2 = can't walk a nl pace on a flat grade s sob but does fine slow and flat [employer] ok Cough: no Sleeping: cpap bed horizontal / one pillow SABA use: rare 02: none  rec Plan A = Automatic = Bevespi Take 2 puffs first thing in am and then another 2 puffs about 12 hours later.   Stop symbiocort and spriva  Work on inhaler technique:  Plan B = Backup Only use your albuterol inhaler as a rescue medication  If not happy with bevespi we can call in stiolto which is the same device as spiriva Low-dose CT lung cancer screening is recommended for patients who are 62 years of age with a 30+ pack-year history of smoking, and who are currently smoking or quit <=15 years ago.          07/19/2020  f/u ov/Jennifer Werner re:  GOLD II  Symb/spiriva  Still smoking but < 1 pk per  week  Chief Complaint  Patient presents with   Follow-up    pt has chest tightness. pt says inhaler opens her up  Dyspnea: still  MMRC1 = can walk nl pace, flat grade, can't hurry or go uphills or steps s sob   Cough:  First thing in am / mucoid  Sleeping: cpap flat / one pillow  SABA use: 3-4 x weekly  02: none  Rec  Continue symbicort / spiriva as you are  The key is to stop smoking completely before smoking completely stops you!   12/06/2021  f/u ov/Jennifer Werner re: GOLD 2  maint on spriva/ symbicort 160 but still smoking   Chief Complaint  Patient presents with   Follow-up    Breathing is overall doing well. She has cough with tan sputum. Smoking 1 ppd.   Dyspnea:  MMRC2 = can't walk a nl pace on a flat grade s sob but does fine slow and flat   Cough: more in am x months Sleeping: cpap flat bed  SABA use: not as much since using spiriva one bid instead of  2 qam  02: none Covid status:   vax  x 3    No obvious day to day or daytime variability or assoc excess/ purulent sputum or mucus plugs or hemoptysis or cp or chest tightness, subjective wheeze or overt sinus or hb symptoms.   Sleeping as above  without nocturnal  or early am exacerbation  of respiratory  c/o's or need for noct saba. Also denies any obvious fluctuation of symptoms with weather or environmental changes or other aggravating or alleviating factors except as outlined above   No unusual exposure hx or h/o childhood pna/ asthma or knowledge of premature birth.  Current Allergies, Complete Past Medical History, Past Surgical History, Family History, and Social History were reviewed in [employer] record.  ROS  The following are not active complaints unless bolded Hoarseness, sore throat, dysphagia, dental problems, itching, sneezing,  nasal congestion or discharge of excess mucus or purulent secretions, ear ache,   fever, chills, sweats, unintended wt loss or wt gain, classically pleuritic or exertional cp,  orthopnea pnd or arm/hand swelling  or leg swelling, presyncope, palpitations, abdominal pain, anorexia, nausea, vomiting, diarrhea  or change in bowel habits or change in bladder habits, change in stools or change in urine, dysuria, hematuria,  rash, arthralgias, visual complaints, headache, numbness, weakness or ataxia or problems with walking or coordination,  change in mood or  memory.        Current Meds  Medication Sig   albuterol (PROAIR HFA) 108 (90 Base) MCG/ACT inhaler 2 puffs every 4 hours as needed only  if your can't catch your breath (Patient taking differently: Inhale 2 puffs into the lungs every 6 (six) hours as needed for wheezing or shortness of breath. 2 puffs every 4 hours as needed only  if your can't catch your breath)   ascorbic acid (VITAMIN C) 1000 MG tablet Take 1,000 mg by mouth daily.   aspirin EC 81 MG tablet Take 81 mg by mouth daily.   atorvastatin (LIPITOR) 10 MG  tablet TAKE 1 TABLET BY MOUTH EVERY DAY (Patient taking differently: Take 10 mg by mouth daily.)   budesonide-formoterol (SYMBICORT) 160-4.5 MCG/ACT inhaler INHALE 2 PUFFS FIRST THING IN THE MORNING THEN ANOTHER 2 PUFFS 12 HOURS LATER   Cholecalciferol (VITAMIN D3) 1.25 MG (50000 UT) CAPS Take 30,000 Units by mouth once a week.   furosemide (LASIX) 40 MG  tablet TAKE 1 TABLET BY MOUTH EVERY DAY   gabapentin (NEURONTIN) 100 MG capsule Take 100 mg by mouth 3 (three) times daily.   ibuprofen (ADVIL,MOTRIN) 200 MG tablet Take 400 mg by mouth as needed for mild pain, moderate pain or headache.   KLOR-CON M10 10 MEQ tablet TAKE 1 TABLET BY MOUTH EVERY DAY   metFORMIN (GLUCOPHAGE) 500 MG tablet Take 500 mg by mouth 2 (two) times daily.    Multiple Vitamin (MULTIVITAMIN) tablet Take 1 tablet by mouth daily. Unknown strength   Omega-3 Fatty Acids (FISH OIL) 1000 MG CAPS Take 1 capsule by mouth daily.   SPIRIVA RESPIMAT 2.5 MCG/ACT AERS Inhale 2 puffs into the lungs daily. INHALE 2 PUFFS BY MOUTH INTO THE LUNGS DAILY   tiZANidine (ZANAFLEX) 4 MG tablet Take 4 mg by mouth 3 (three) times daily as needed for muscle spasms.                  Objective:   Physical Exam   wts  12/06/2021            207   07/19/2020       189  06/09/2019         237  12/06/2018         233  08/22/2018       223   05/21/18 216 lb (98 kg)  04/30/18 214 lb 1.9 oz (97.1 kg)  04/25/18 215 lb (97.5 kg)      Vital signs reviewed  12/06/2021  - Note at rest 02 sats  94% on RA   General appearance:    amb mod obese wf nad    HEENT : pt wearing mask not removed for exam due to covid - 19 concerns.   NECK :  without JVD/Nodes/TM/ nl carotid upstrokes bilaterally   LUNGS: no acc muscle use,  Min barrel  contour chest wall with bilateral  slightly decreased bs s audible wheeze and  without cough on insp or exp maneuvers and min  Hyperresonant  to  percussion bilaterally     CV:  RRR  no s3 or murmur or increase in P2, and no  edema   ABD:  soft and nontender with pos end  insp Hoover's  in the supine position. No bruits or organomegaly appreciated, bowel sounds nl  MS:   Nl gait/  ext warm without deformities, calf tenderness, cyanosis or clubbing No obvious joint restrictions   SKIN: warm and dry without lesions    NEURO:  alert, approp, nl sensorium with  no motor or cerebellar deficits apparent.                  Assessment:

## 2021-12-17 ENCOUNTER — Other Ambulatory Visit: Payer: Self-pay | Admitting: Cardiology

## 2021-12-17 ENCOUNTER — Other Ambulatory Visit: Payer: Self-pay | Admitting: Internal Medicine

## 2022-09-28 ENCOUNTER — Other Ambulatory Visit: Payer: Self-pay | Admitting: Cardiology

## 2022-10-23 ENCOUNTER — Encounter: Payer: Self-pay | Admitting: *Deleted

## 2022-10-24 ENCOUNTER — Telehealth: Payer: No Typology Code available for payment source | Admitting: Adult Health

## 2022-10-25 ENCOUNTER — Other Ambulatory Visit: Payer: Self-pay | Admitting: Cardiology

## 2022-10-25 MED ORDER — POTASSIUM CHLORIDE CRYS ER 10 MEQ PO TBCR: 10.0000 meq | EXTENDED_RELEASE_TABLET | Freq: Every day | ORAL | 0 refills | Status: AC

## 2022-10-29 ENCOUNTER — Other Ambulatory Visit: Payer: Self-pay | Admitting: Cardiology

## 2022-11-05 ENCOUNTER — Other Ambulatory Visit: Payer: Self-pay | Admitting: Internal Medicine

## 2022-11-07 ENCOUNTER — Other Ambulatory Visit: Payer: Self-pay | Admitting: Cardiology

## 2022-11-20 ENCOUNTER — Other Ambulatory Visit: Payer: Self-pay | Admitting: Cardiology

## 2022-11-20 NOTE — Telephone Encounter (Signed)
Rx refill sent to pharmacy. 

## 2022-12-09 ENCOUNTER — Other Ambulatory Visit: Payer: Self-pay | Admitting: Internal Medicine

## 2022-12-13 ENCOUNTER — Other Ambulatory Visit: Payer: Self-pay | Admitting: Cardiology

## 2022-12-13 NOTE — Telephone Encounter (Signed)
Rx refill sent to pharmacy. 

## 2022-12-28 ENCOUNTER — Encounter: Payer: Self-pay | Admitting: Gastroenterology

## 2023-01-03 ENCOUNTER — Encounter: Payer: Self-pay | Admitting: Gastroenterology

## 2023-01-06 ENCOUNTER — Other Ambulatory Visit: Payer: Self-pay | Admitting: Cardiology

## 2023-01-07 NOTE — Progress Notes (Unsigned)
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 worse in winter with baseline 120 lf then doe x 2017 worse since winter of 2019 rx with advair 500 dose /combivent and prednisone > 50% back to baseline so referred to pulmonary clinic 05/21/2018 by Windell Hummingbird.   Sleep study 05/15/18 c/w AHI 8 but desats > plan cpap titration byDr Rexene Alberts    05/21/2018 1st [facility] office visit/ Jennifer Werner   Chief Complaint  Patient presents with   Pulmonary Consult    Referred by Windell Hummingbird, PA for eval of hypoxia.  She states she has been having SOB since Winter 2017. She states she presented to her PCP with SOB in April 2019 and had o2 sat of 88%ra. She states she gets SOB walking for approx 5-10 min.  She has occ cough, prod in the am after inhaler with tan sputum.    sleeps horizontal rotated on L side wakes up each am feeling tired / hits snooze button twice /some am cough/ congestion and then uses combivent x one and w/in 15 min better then later on in the am finally gets around to taking the advair 500. Breathing worse in hot weather. 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 Plan A = Automatic = Symbicort 160 Take 2 puffs first thing in am and then another 2 puffs about 12 hours later.  Work on inhaler technique:  relax and gently blow all the way out then take a nice smooth deep breath back in, triggering the inhaler at same time you start breathing in.  Hold for up to 5 seconds if you can. Blow out thru nose. 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 She is using her proair inhaler 1-2 x per wk.  Dyspnea:  MMRC2 = can't walk a nl pace on a flat grade s sob but does fine slow and flat [employer] ok Cough: no Sleeping: cpap bed horizontal / one pillow SABA use: rare 02: none  rec Plan A = Automatic = Bevespi Take 2 puffs first thing in am and then another 2 puffs about 12 hours later.   Stop symbiocort and spriva  Work on inhaler technique:  Plan B = Backup Only use your albuterol inhaler as a rescue medication  If not happy with bevespi we can call in stiolto which is the same device as spiriva Low-dose CT lung cancer screening is recommended for patients who are 77-81 years of age with a 30+ pack-year history of smoking, and who are currently smoking or quit <=15 years ago.          07/19/2020  f/u ov/Jennifer Werner re:  GOLD II  Symb/spiriva  Still smoking but < 1 pk per  week  Chief Complaint  Patient presents with   Follow-up    pt has chest tightness. pt says inhaler opens her up  Dyspnea: still  MMRC1 = can walk nl pace, flat grade, can't hurry or go uphills or steps s sob   Cough:  First thing in am / mucoid  Sleeping: cpap flat / one pillow  SABA use: 3-4 x weekly  02: none  Rec  Continue symbicort / spiriva as you are  The key is to stop smoking completely before smoking completely stops you!   12/06/2021  f/u ov/Jennifer Werner re: GOLD 2  maint on spriva/ symbicort 160 but still smoking   Chief Complaint  Patient presents with   Follow-up    Breathing is overall doing well. She has cough with tan sputum. Smoking 1 ppd.   Dyspnea:  MMRC2 = can't walk a nl pace on a flat grade s sob but does fine slow and flat   Cough: more in am x months Sleeping: cpap flat bed  SABA use: not as much since using spiriva one bid instead of  2 qam  02: none Covid status:   vax  x 3  Rec No change in medications  Keep appt for annual lung cancer screening   The key is to stop smoking completely before smoking completely stops you!    01/09/2023  f/u ov/Jennifer Werner re: ***   maint on ***  No chief complaint on file.   Dyspnea:  *** Cough: *** Sleeping: *** SABA use: *** 02: *** Covid status:   *** Lung cancer screening :  ***    No obvious day to day or daytime variability or assoc excess/ purulent sputum or mucus plugs or hemoptysis or cp or chest tightness, subjective wheeze or overt sinus or hb symptoms.   *** without nocturnal  or early am exacerbation  of respiratory  c/o's or need for noct saba. Also denies any obvious fluctuation of symptoms with weather or environmental changes or other aggravating or alleviating factors except as outlined above   No unusual exposure hx or h/o childhood pna/ asthma or knowledge of premature birth.  Current Allergies, Complete Past Medical History, Past Surgical History, Family History, and Social History were reviewed in [employer] record.  ROS  The following are not active complaints unless bolded Hoarseness, sore throat, dysphagia, dental problems, itching, sneezing,  nasal congestion or discharge of excess mucus or purulent secretions, ear ache,   fever, chills, sweats, unintended wt loss or wt gain, classically pleuritic or exertional cp,  orthopnea pnd or arm/hand swelling  or leg swelling, presyncope, palpitations, abdominal pain, anorexia, nausea, vomiting, diarrhea  or change in bowel habits or change in bladder habits, change in stools or change in urine, dysuria, hematuria,  rash, arthralgias, visual complaints, headache, numbness, weakness or ataxia or problems with walking or coordination,  change in mood or  memory.        No outpatient medications have been marked as taking for the 01/09/23 encounter (Appointment) with Tanda Rockers, MD.                   Objective:   Physical  Exam   wts  01/09/2023            ***  12/06/2021            207   07/19/2020       189  06/09/2019         237  12/06/2018         233  08/22/2018       223   05/21/18 216 lb (98 kg)  04/30/18 214 lb 1.9 oz (97.1 kg)  04/25/18 215 lb (97.5 kg)     Vital signs reviewed  01/09/2023  - Note at rest 02 sats  ***% on ***   General appearance:    ***     Min bar ***                    Assessment:

## 2023-01-08 NOTE — Telephone Encounter (Signed)
Rx refill sent to pharmacy. 

## 2023-01-09 ENCOUNTER — Encounter: Payer: Self-pay | Admitting: Internal Medicine

## 2023-01-09 ENCOUNTER — Ambulatory Visit: Payer: No Typology Code available for payment source | Admitting: Internal Medicine

## 2023-01-09 VITALS — BP 128/70 | HR 74 | Temp 97.8°F | Ht 64.0 in | Wt 197.0 lb

## 2023-01-09 DIAGNOSIS — F17210 Nicotine dependence, cigarettes, uncomplicated: Secondary | ICD-10-CM | POA: Diagnosis not present

## 2023-01-09 DIAGNOSIS — J449 Chronic obstructive pulmonary disease, unspecified: Secondary | ICD-10-CM | POA: Diagnosis not present

## 2023-01-09 MED ORDER — BUDESONIDE-FORMOTEROL FUMARATE 160-4.5 MCG/ACT IN AERO: INHALATION_SPRAY | RESPIRATORY_TRACT | 3 refills | Status: DC

## 2023-01-09 MED ORDER — SPIRIVA RESPIMAT 2.5 MCG/ACT IN AERS: 2.0000 | INHALATION_SPRAY | Freq: Every day | RESPIRATORY_TRACT | 3 refills | Status: DC

## 2023-01-09 NOTE — Patient Instructions (Signed)
Plan A = Automatic = Always=    symbicort 160 and spiriva 2 puffs in am and repeat symbicort 160 x 2 in pam   Plan B = Backup (to supplement plan A, not to replace it) Only use your albuterol inhaler as a rescue medication to be used if you can't catch your breath by resting or doing a relaxed purse lip breathing pattern.  - The less you use it, the better it will work when you need it. - Ok to use the inhaler up to 2 puffs  every 4 hours if you must but call for appointment if use goes up over your usual need - Don't leave home without it !!  (think of it like the spare tire for your car)    The key is to stop smoking completely before smoking completely stops you and make sure you get your annual  low dose CT chest    Please remember to go to the lab department   for your tests - we will call you with the results when they are available.      Please schedule a follow up visit in 12  months but call sooner if needed

## 2023-01-09 NOTE — Assessment & Plan Note (Signed)
Active smoker  05/21/2018  Walked RA x 3 laps @ 185 ft each stopped due to  End of study, fast pace, no desat but sob at end of study   Spirometry 05/21/2018  FEV1 1.22 (45%)  Ratio 58 p am advair 500 PFT's  08/22/2018  FEV1 1.50 (56 % ) ratio 65  p 20 % improvement from saba p symb 160 x2  prior to study with DLCO  72 % corrects to 90 % for alv volume   - 12/06/2018   try bevespi 2 bid if insurance covers> preferred symb/spiriva  - 01/09/2023  After extensive coaching inhaler device,  effectiveness =   80% short ti   - Labs ordered 01/09/2023  :    alpha one AT phenotype     Group D (now reclassified as E) in terms of symptom/risk and laba/lama/ICS  therefore appropriate rx at this point >>>  continue symbicort 160/spiriva 2.5 and approp saba.  Re SABA :  I spent extra time with pt today reviewing appropriate use of albuterol for prn use on exertion with the following points: 1) saba is for relief of sob that does not improve by walking a slower pace or resting but rather if the pt does not improve after trying this first. 2) If the pt is convinced, as many are, that saba helps recover from activity faster then it's easy to tell if this is the case by re-challenging : ie stop, take the inhaler, then p 5 minutes try the exact same activity (intensity of workload) that just caused the symptoms and see if they are substantially diminished or not after saba 3) if there is an activity that reproducibly causes the symptoms, try the saba 15 min before the activity on alternate days   If in fact the saba really does help, then fine to continue to use it prn but advised may need to look closer at the maintenance regimen being used to achieve better control of airways disease with exertion.

## 2023-01-09 NOTE — Assessment & Plan Note (Signed)
Counseled re importance of smoking cessation but did not meet time criteria for separate billing    Low-dose CT lung cancer screening is recommended for patients who are 37-63 years of age with a 20+ pack-year history of smoking and who are currently smoking or quit <=15 years ago. No coughing up blood  No unintentional weight loss of > 15 pounds in the last 6 months - pt is eligible for scanning yearly until 15 y p quits > per PCP already scheduled  F/u here can by yearly, call sooner prn          Each maintenance medication was reviewed in detail including emphasizing most importantly the difference between maintenance and prns and under what circumstances the prns are to be triggered using an action plan format where appropriate.  Total time for H and P, chart review, counseling, reviewing hfa /smi device(s) and generating customized AVS unique to this office visit / same day charting = 30 min for annual eval.

## 2023-01-16 LAB — ALPHA-1-ANTITRYPSIN PHENOTYP: A-1 Antitrypsin: 167 mg/dL (ref 101–187)

## 2023-02-03 ENCOUNTER — Other Ambulatory Visit: Payer: Self-pay | Admitting: Cardiology

## 2023-02-15 ENCOUNTER — Other Ambulatory Visit: Payer: Self-pay | Admitting: Cardiology

## 2023-02-23 ENCOUNTER — Ambulatory Visit (AMBULATORY_SURGERY_CENTER): Payer: No Typology Code available for payment source

## 2023-02-23 VITALS — Ht 64.0 in | Wt 192.0 lb

## 2023-02-23 DIAGNOSIS — Z86010 Personal history of colonic polyps: Secondary | ICD-10-CM

## 2023-02-23 MED ORDER — NA SULFATE-K SULFATE-MG SULF 17.5-3.13-1.6 GM/177ML PO SOLN: 1.0000 | Freq: Once | ORAL | 0 refills | Status: AC

## 2023-02-23 NOTE — Progress Notes (Signed)

## 2023-03-05 ENCOUNTER — Other Ambulatory Visit: Payer: Self-pay | Admitting: Cardiology

## 2023-03-05 NOTE — Telephone Encounter (Signed)
Rx refill sent to pharmacy. 

## 2023-03-16 ENCOUNTER — Encounter: Payer: Self-pay | Admitting: Gastroenterology

## 2023-03-16 ENCOUNTER — Ambulatory Visit (AMBULATORY_SURGERY_CENTER): Payer: No Typology Code available for payment source | Admitting: Gastroenterology

## 2023-03-16 VITALS — BP 118/56 | HR 65 | Temp 98.4°F | Resp 20 | Ht 64.0 in | Wt 190.8 lb

## 2023-03-16 DIAGNOSIS — Z86010 Personal history of colonic polyps: Secondary | ICD-10-CM | POA: Diagnosis not present

## 2023-03-16 DIAGNOSIS — D128 Benign neoplasm of rectum: Secondary | ICD-10-CM | POA: Diagnosis not present

## 2023-03-16 DIAGNOSIS — Z09 Encounter for follow-up examination after completed treatment for conditions other than malignant neoplasm: Secondary | ICD-10-CM | POA: Diagnosis present

## 2023-03-16 DIAGNOSIS — D123 Benign neoplasm of transverse colon: Secondary | ICD-10-CM | POA: Diagnosis not present

## 2023-03-16 DIAGNOSIS — D125 Benign neoplasm of sigmoid colon: Secondary | ICD-10-CM | POA: Diagnosis not present

## 2023-03-16 DIAGNOSIS — K621 Rectal polyp: Secondary | ICD-10-CM

## 2023-03-16 DIAGNOSIS — D122 Benign neoplasm of ascending colon: Secondary | ICD-10-CM | POA: Diagnosis not present

## 2023-03-16 DIAGNOSIS — D124 Benign neoplasm of descending colon: Secondary | ICD-10-CM

## 2023-03-16 DIAGNOSIS — K635 Polyp of colon: Secondary | ICD-10-CM | POA: Diagnosis not present

## 2023-03-16 DIAGNOSIS — K640 First degree hemorrhoids: Secondary | ICD-10-CM

## 2023-03-16 DIAGNOSIS — K5730 Diverticulosis of large intestine without perforation or abscess without bleeding: Secondary | ICD-10-CM

## 2023-03-16 MED ORDER — SODIUM CHLORIDE 0.9 % IV SOLN: 500.0000 mL | INTRAVENOUS | Status: DC

## 2023-03-16 NOTE — Op Note (Signed)
[facility] Patient Name: Jennifer Werner Procedure Date: 03/16/2023 10:55 AM MRN: [id number] Endoscopist: Doristine Locks , MD, [id number] Age: 62 Referring MD:  Date of Birth: 1961-08-26 Gender: Female Account #: [id number] Procedure:                Colonoscopy Indications:              Surveillance: Personal history of adenomatous                            polyps on last colonoscopy 3 years ago                           -Colonoscopy in 09/2018 with 4 tubular adenomas                            resected from transverse colon and sigmoid, along                            with 15 small HPs resected from rectosigmoid colon/                            rectum                           -Colonoscopy 11/2019 with 3 small 3-5 mm adenomas,                            2 small sigmoid hyperplastic polyps, and multiple                            rectal hyperplastic polyps, along with                            pandiverticulosis, lipoma in the ascending colon,                            and internal hemorrhoids, with recommendation to                            repeat in 3 years Medicines:                Monitored Anesthesia Care Procedure:                Pre-Anesthesia Assessment:                           - Prior to the procedure, a History and Physical                            was performed, and patient medications and                            allergies were reviewed. The patient's tolerance of                            previous anesthesia was  also reviewed. The risks                            and benefits of the procedure and the sedation                            options and risks were discussed with the patient.                            All questions were answered, and informed consent                            was obtained. Prior Anticoagulants: The patient has                            taken no anticoagulant or antiplatelet agents. ASA                            Grade  Assessment: III - A patient with severe                            systemic disease. After reviewing the risks and                            benefits, the patient was deemed in satisfactory                            condition to undergo the procedure.                           After obtaining informed consent, the colonoscope                            was passed under direct vision. Throughout the                            procedure, the patient's blood pressure, pulse, and                            oxygen saturations were monitored continuously. The                            Olympus CF-HQ190L SN [id number] was introduced through                            the anus and advanced to the the cecum, identified                            by the ileocecal valve. The colonoscopy was                            performed without difficulty. The patient tolerated  the procedure well. The quality of the bowel                            preparation was good. The ileocecal valve,                            appendiceal orifice, and rectum were photographed. Scope In: 11:05:27 AM Scope Out: 11:28:23 AM Scope Withdrawal Time: 0 hours 18 minutes 10 seconds  Total Procedure Duration: 0 hours 22 minutes 56 seconds  Findings:                 The perianal and digital rectal examinations were                            normal.                           Eight sessile polyps were found in the descending                            colon (1), transverse colon (5), and ascending                            colon (2). The polyps were 3 to 6 mm in size. These                            polyps were removed with a cold snare. Resection                            and retrieval were complete. Estimated blood loss                            was minimal.                           Three sessile polyps were found in the sigmoid                            colon. The polyps were 2 to 4 mm in size.  These                            polyps were removed with a cold snare. Resection                            and retrieval were complete. Estimated blood loss                            was minimal.                           Multiple sessile polyps were found in the rectum.                            The polyps were diminutive in size. Several of  these polyps were removed with a cold snare.                            Resection and retrieval were complete. Estimated                            blood loss was minimal.                           There was a small lipoma, in the ascending colon.                           Multiple large-mouthed and small-mouthed                            diverticula were found in the entire colon.                           Non-bleeding internal hemorrhoids were found during                            retroflexion. The hemorrhoids were small. Complications:            No immediate complications. Estimated Blood Loss:     Estimated blood loss was minimal. Impression:               - Eight 3 to 6 mm polyps in the descending colon,                            in the transverse colon and in the ascending colon,                            removed with a cold snare. Resected and retrieved.                           - Three 2 to 4 mm polyps in the sigmoid colon,                            removed with a cold snare. Resected and retrieved.                           - Multiple diminutive polyps in the rectum, removed                            with a cold snare. Resected and retrieved.                           - Small lipoma in the ascending colon.                           - Diverticulosis in the entire examined colon.                           - Non-bleeding internal hemorrhoids.                           -  The GI Genius (intelligent endoscopy module),                            computer-aided polyp detection system powered by AI                             was utilized to detect colorectal polyps through                            enhanced visualization during colonoscopy. Recommendation:           - Patient has a contact number available for                            emergencies. The signs and symptoms of potential                            delayed complications were discussed with the                            patient. Return to normal activities tomorrow.                            Written discharge instructions were provided to the                            patient.                           - Resume previous diet.                           - Continue present medications.                           - Await pathology results.                           - Repeat colonoscopy for surveillance based on                            pathology results.                           - Return to GI clinic PRN. Doristine Locks, MD 03/16/2023 11:36:51 AM

## 2023-03-16 NOTE — Progress Notes (Signed)
Pt's states no medical or surgical changes since previsit or office visit. 

## 2023-03-16 NOTE — Progress Notes (Signed)
Vss nad trans to pacu 

## 2023-03-16 NOTE — Progress Notes (Signed)
Called to room to assist during endoscopic procedure.  Patient ID and intended procedure confirmed with present staff. Received instructions for my participation in the procedure from the performing physician.  

## 2023-03-16 NOTE — Progress Notes (Signed)
GASTROENTEROLOGY PROCEDURE H&P NOTE   Primary Care Physician: Morrell Riddle, PA-C    Reason for Procedure:  Colon Cancer screening, colon polyp surveillance  Plan:    Colonoscopy  Patient is appropriate for endoscopic procedure(s) in the ambulatory ([facility]) setting.  The nature of the procedure, as well as the risks, benefits, and alternatives were carefully and thoroughly reviewed with the patient. Ample time for discussion and questions allowed. The patient understood, was satisfied, and agreed to proceed.     HPI: Jennifer Werner is a 62 y.o. female who presents for colonoscopy for ongoing polyp surveillance.  No active GI symptoms.  No known family history of colon cancer or related malignancy.  Patient is otherwise without complaints or active issues today.  -Colonoscopy in 09/2018 with 4 tubular adenomas resected from transverse colon and sigmoid, along with 15 small HPs resected from rectosigmoid colon/ rectum -Colonoscopy 11/2019 with 3 small 3-5 mm adenomas, 2 small sigmoid hyperplastic polyps, and multiple rectal hyperplastic polyps, along with pandiverticulosis, lipoma in the ascending colon, and internal hemorrhoids, with recommendation to repeat in 3 years  Past Medical History:  Diagnosis Date   Allergy    Anemia    Arthritis    Cancer (HCC)    skin    Chronic venous insufficiency 05/29/2018   Cigarette smoker 04/02/2018   COPD (chronic obstructive pulmonary disease) (HCC)    COPD GOLD  II  04/09/2018   Quit smoking 05/2018  05/21/2018  Walked RA x 3 laps @ 185 ft each stopped due to  End of study, fast pace, no desat but sob at end of study   Spirometry 05/21/2018  FEV1 1.22 (45%)  Ratio 58 p am advair 500 PFT's  08/22/2018  FEV1 1.50 (56 % ) ratio 65  p 20 % improvement from saba p symb 160 x2  prior to study with DLCO  72 % corrects to 90 % for alv volume   - 12/06/2018   try bevespi 2 bid if insura   COVID-19 08/04/2021   Dyspnea on exertion 04/09/2018   Eczema     Elevated AST (SGOT) 08/25/2019   Elevated LDL cholesterol level 04/06/2020   Equinus deformity of foot, acquired 03/04/2014   Former smoker 04/02/2018   Formatting of this note might be different from the original. Last Assessment & Plan:  4-5 min discussion re active cigarette smoking in addition to office E&M  Ask about tobacco use:  Ongoing, down to 10 per day Advise quitting:    I emphasized that although we never turn away smokers from the pulmonary clinic, we do ask that they understand that the recommendations that we make  won't work nearl   GERD (gastroesophageal reflux disease)    Hidradenitis suppurativa 01/05/2020   Insulin resistance 08/25/2019   Metatarsal deformity 03/04/2014   Morbid obesity complicated by osa/aodm 12/07/2018   Obesity, Class I, BMI 30-34.9 04/05/2020   Obstructive sleep apnea on CPAP 09/09/2018   wears CPAP nightly   Pedal edema 08/25/2019   Plantar fasciitis of left foot 03/04/2014   Right carpal tunnel syndrome 10/14/2018   Sebaceous cyst 11/05/2018   Sleep apnea    wears CPAP nightly   Smoker    Varicose veins of bilateral lower extremities with other complications 05/29/2018    Past Surgical History:  Procedure Laterality Date   ABLATION     Uterus   COLONOSCOPY  09/20/2018   ENDOVENOUS ABLATION SAPHENOUS VEIN W/ LASER Left 09/25/2018   endovenous laser  ablation left greater saphenous vein by Fabienne Bruns MD    EXCISION MASS HEAD Right 12/02/2018   Procedure: EXCISION RIGHT PERIORBITAL SEBACEOUS CYST;  Surgeon: Peggye Form, DO;  Location: [facility];  Service: Plastics;  Laterality: Right;   POLYPECTOMY      Prior to Admission medications   Medication Sig Start Date End Date Taking? Authorizing Provider  albuterol (VENTOLIN HFA) 108 (90 Base) MCG/ACT inhaler 2 PUFFS EVERY 4 HOURS AS NEEDED ONLY IF YOUR CAN'T CATCH YOUR BREATH 11/06/22   Nyoka Cowden, MD  ascorbic acid (VITAMIN C) 1000 MG tablet Take 1,000 mg by  mouth daily.    [provider]  aspirin EC 81 MG tablet Take 81 mg by mouth daily.    [provider]  atorvastatin (LIPITOR) 10 MG tablet TAKE 1 TABLET BY MOUTH EVERY DAY 03/05/23   Georgeanna Lea, MD  budesonide-formoterol (SYMBICORT) 160-4.5 MCG/ACT inhaler INHALE 2 PUFFS FIRST THING IN THE MORNING THEN ANOTHER 2 PUFFS 12 HOURS LATER 01/09/23   Nyoka Cowden, MD  Cholecalciferol (VITAMIN D3) 1.25 MG (50000 UT) CAPS Take 40,000 Units by mouth once a week.    [provider]  furosemide (LASIX) 40 MG tablet Take 1 tablet (40 mg total) by mouth daily. Patient not taking: Reported on 02/23/2023 10/25/22   Georgeanna Lea, MD  gabapentin (NEURONTIN) 100 MG capsule Take 100 mg by mouth at bedtime as needed.    [provider]  ibuprofen (ADVIL,MOTRIN) 200 MG tablet Take 400 mg by mouth as needed for mild pain, moderate pain or headache.    [provider]  metFORMIN (GLUCOPHAGE) 500 MG tablet Take 500 mg by mouth 2 (two) times daily.     [provider]  Multiple Vitamin (MULTIVITAMIN) tablet Take 1 tablet by mouth daily. Unknown strength    [provider]  Omega-3 Fatty Acids (FISH OIL) 1000 MG CAPS Take 1 capsule by mouth daily.    [provider]  potassium chloride (KLOR-CON M10) 10 MEQ tablet Take 1 tablet (10 mEq total) by mouth daily. Patient not taking: Reported on 02/23/2023 10/25/22   Georgeanna Lea, MD  SPIRIVA RESPIMAT 2.5 MCG/ACT AERS Inhale 2 puffs into the lungs daily. INHALE 2 PUFFS BY MOUTH INTO THE LUNGS DAILY 01/09/23   Nyoka Cowden, MD  spironolactone (ALDACTONE) 100 MG tablet Take 100 mg by mouth daily.    [provider]  tiZANidine (ZANAFLEX) 4 MG tablet Take 4 mg by mouth 3 (three) times daily as needed for muscle spasms. 05/12/20   [provider]    Current Outpatient Medications  Medication Sig Dispense Refill   albuterol (VENTOLIN HFA) 108 (90 Base) MCG/ACT inhaler 2  PUFFS EVERY 4 HOURS AS NEEDED ONLY IF YOUR CAN'T CATCH YOUR BREATH 8.5 each 5   ascorbic acid (VITAMIN C) 1000 MG tablet Take 1,000 mg by mouth daily.     aspirin EC 81 MG tablet Take 81 mg by mouth daily.     atorvastatin (LIPITOR) 10 MG tablet TAKE 1 TABLET BY MOUTH EVERY DAY 15 tablet 0   budesonide-formoterol (SYMBICORT) 160-4.5 MCG/ACT inhaler INHALE 2 PUFFS FIRST THING IN THE MORNING THEN ANOTHER 2 PUFFS 12 HOURS LATER 30.6 each 3   Cholecalciferol (VITAMIN D3) 1.25 MG (50000 UT) CAPS Take 40,000 Units by mouth once a week.     furosemide (LASIX) 40 MG tablet Take 1 tablet (40 mg total) by mouth daily. (Patient not taking: Reported on  02/23/2023) 15 tablet 0   gabapentin (NEURONTIN) 100 MG capsule Take 100 mg by mouth at bedtime as needed.     ibuprofen (ADVIL,MOTRIN) 200 MG tablet Take 400 mg by mouth as needed for mild pain, moderate pain or headache.     metFORMIN (GLUCOPHAGE) 500 MG tablet Take 500 mg by mouth 2 (two) times daily.      Multiple Vitamin (MULTIVITAMIN) tablet Take 1 tablet by mouth daily. Unknown strength     Omega-3 Fatty Acids (FISH OIL) 1000 MG CAPS Take 1 capsule by mouth daily.     potassium chloride (KLOR-CON M10) 10 MEQ tablet Take 1 tablet (10 mEq total) by mouth daily. (Patient not taking: Reported on 02/23/2023) 15 tablet 0   SPIRIVA RESPIMAT 2.5 MCG/ACT AERS Inhale 2 puffs into the lungs daily. INHALE 2 PUFFS BY MOUTH INTO THE LUNGS DAILY 12 g 3   spironolactone (ALDACTONE) 100 MG tablet Take 100 mg by mouth daily.     tiZANidine (ZANAFLEX) 4 MG tablet Take 4 mg by mouth 3 (three) times daily as needed for muscle spasms.     Current Facility-Administered Medications  Medication Dose Route Frequency Provider Last Rate Last Admin   0.9 %  sodium chloride infusion  500 mL Intravenous Continuous Satya Buttram V, DO        Allergies as of 03/16/2023 - Review Complete 03/16/2023  Allergen Reaction Noted   Latex Rash 06/26/2012   Codeine  04/02/2018    Family  History  Problem Relation Age of Onset   Heart block Mother    Lymphoma Mother    Congestive Heart Failure Mother    Cardiomyopathy Father    Alcohol abuse Father    Cardiomyopathy Brother        Had Heart Transplant   Alcohol abuse Brother    Asthma Brother    Cardiomyopathy Paternal Aunt    Cardiomyopathy Paternal Uncle    Rheum arthritis Paternal Grandmother    Colon cancer Neg Hx    Esophageal cancer Neg Hx    Rectal cancer Neg Hx    Stomach cancer Neg Hx    Colon polyps Neg Hx     Social History   Socioeconomic History   Marital status: Married    Spouse name: Not on file   Number of children: Not on file   Years of education: Not on file   Highest education level: Not on file  Occupational History   Not on file  Tobacco Use   Smoking status: Every Day    Packs/day: 1.00    Years: 47.00    Additional pack years: 0.00    Total pack years: 47.00    Types: Cigarettes   Smokeless tobacco: Never  Vaping Use   Vaping Use: Never used  Substance and Sexual Activity   Alcohol use: Yes    Alcohol/week: 6.0 standard drinks of alcohol    Types: 6 Standard drinks or equivalent per week   Drug use: Never   Sexual activity: Yes    Birth control/protection: Surgical  Other Topics Concern   Not on file  Social History Narrative   RN - desk work mainly   Lives with husband   Social Determinants of Health   Financial Resource Strain: Not on file  Food Insecurity: Not on file  Transportation Needs: Not on file  Physical Activity: Not on file  Stress: Not on file  Social Connections: Not on file  Intimate Partner Violence: Not on file  Physical Exam: Vital signs in last 24 hours:  were no vitals taken for this visit. GEN: NAD EYE: Sclerae anicteric ENT: MMM CV: Non-tachycardic Pulm: CTA b/l GI: Soft, NT/ND NEURO:  Alert & Oriented x 3   Doristine Locks, DO [facility]   03/16/2023 10:23 AM

## 2023-03-16 NOTE — Patient Instructions (Addendum)
Handout on polyps, hemorrhoids, and diverticulosis given to patient Await pathology results Resume previous diet and continue present medications Repeat colonoscopy for surveillance will be determined based off of pathology results Return to GI office as needed   YOU HAD AN ENDOSCOPIC PROCEDURE TODAY AT THE [facility]:   Refer to the procedure report that was given to you for any specific questions about what was found during the examination.  If the procedure report does not answer your questions, please call your gastroenterologist to clarify.  If you requested that your care partner not be given the details of your procedure findings, then the procedure report has been included in a sealed envelope for you to review at your convenience later.  YOU SHOULD EXPECT: Some feelings of bloating in the abdomen. Passage of more gas than usual.  Walking can help get rid of the air that was put into your GI tract during the procedure and reduce the bloating. If you had a lower endoscopy (such as a colonoscopy or flexible sigmoidoscopy) you may notice spotting of blood in your stool or on the toilet paper. If you underwent a bowel prep for your procedure, you may not have a normal bowel movement for a few days.  Please Note:  You might notice some irritation and congestion in your nose or some drainage.  This is from the oxygen used during your procedure.  There is no need for concern and it should clear up in a day or so.  SYMPTOMS TO REPORT IMMEDIATELY:  Following lower endoscopy (colonoscopy or flexible sigmoidoscopy):  Excessive amounts of blood in the stool  Significant tenderness or worsening of abdominal pains  Swelling of the abdomen that is new, acute  Fever of 100F or higher   For urgent or emergent issues, a gastroenterologist can be reached at any hour by calling [phone]. Do not use MyChart messaging for urgent concerns.    DIET:  We do recommend a small meal at  first, but then you may proceed to your regular diet.  Drink plenty of fluids but you should avoid alcoholic beverages for 24 hours.  ACTIVITY:  You should plan to take it easy for the rest of today and you should NOT DRIVE or use heavy machinery until tomorrow (because of the sedation medicines used during the test).    FOLLOW UP: Our staff will call the number listed on your records the next business day following your procedure.  We will call around 7:15- 8:00 am to check on you and address any questions or concerns that you may have regarding the information given to you following your procedure. If we do not reach you, we will leave a message.     If any biopsies were taken you will be contacted by phone or by letter within the next 1-3 weeks.  Please call us at [phone] if you have not heard about the biopsies in 3 weeks.    SIGNATURES/CONFIDENTIALITY: You and/or your care partner have signed paperwork which will be entered into your electronic medical record.  These signatures attest to the fact that that the information above on your After Visit Summary has been reviewed and is understood.  Full responsibility of the confidentiality of this discharge information lies with you and/or your care-partner.

## 2023-03-18 ENCOUNTER — Other Ambulatory Visit: Payer: Self-pay | Admitting: Cardiology

## 2023-03-19 ENCOUNTER — Telehealth: Payer: Self-pay | Admitting: *Deleted

## 2023-03-19 NOTE — Telephone Encounter (Signed)
  Follow up Call-     03/16/2023   10:32 AM  Call back number  Post procedure Call Back phone  # [phone]  Permission to leave phone message Yes     Patient questions:  Do you have a fever, pain , or abdominal swelling? No. Pain Score  0 *  Have you tolerated food without any problems? Yes.    Have you been able to return to your normal activities? Yes.    Do you have any questions about your discharge instructions: Diet   No. Medications  No. Follow up visit  No.  Do you have questions or concerns about your Care? No.  Actions: * If pain score is 4 or above: No action needed, pain <4.

## 2023-03-23 ENCOUNTER — Encounter: Payer: Self-pay | Admitting: Gastroenterology

## 2023-10-03 ENCOUNTER — Other Ambulatory Visit: Payer: Self-pay | Admitting: Internal Medicine

## 2023-12-17 ENCOUNTER — Other Ambulatory Visit: Payer: Self-pay | Admitting: Internal Medicine

## 2023-12-18 NOTE — Telephone Encounter (Signed)
Pt needs an appointment for further refills  

## 2024-01-17 ENCOUNTER — Other Ambulatory Visit: Payer: Self-pay | Admitting: Internal Medicine

## 2024-01-17 NOTE — Telephone Encounter (Signed)
Courtesy refill. Pt will not have another refill until an appointment

## 2024-02-08 ENCOUNTER — Other Ambulatory Visit: Payer: Self-pay | Admitting: Internal Medicine

## 2024-02-13 ENCOUNTER — Other Ambulatory Visit: Payer: Self-pay | Admitting: Internal Medicine

## 2024-02-18 ENCOUNTER — Other Ambulatory Visit: Payer: Self-pay

## 2024-02-18 MED ORDER — SPIRIVA RESPIMAT 2.5 MCG/ACT IN AERS: INHALATION_SPRAY | RESPIRATORY_TRACT | 11 refills | Status: DC

## 2024-02-18 NOTE — Telephone Encounter (Signed)
 Drug-Drug: potassium chloride and Spiriva RespimatCoadministration of Agents with Clinically Relevant Anticholinergic Effects (eg, Agents with Clinically Relevant Anticholinergic Effects) and solid dosage forms of potassium chloride or potassium citrate may increase the ulcerogenic effect potassium chloride or potassium citrate.   Dr. Sherene Sires, please advise if okay to refill Spriva. Thanks.

## 2024-04-13 NOTE — Progress Notes (Unsigned)
 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 worse in winter with baseline 120 lf then doe x 2017 worse since winter of 2019 rx with advair 500 dose /combivent and prednisone  > 50% back to baseline so referred to pulmonary clinic 05/21/2018 by Oleta Bernhardt.   Sleep study 05/15/18 c/w AHI 8 but desats > plan cpap titration byDr Omar Bibber    05/21/2018 [facility] office visit/ Jennifer Werner   Chief Complaint  Patient presents with   Pulmonary Consult    Referred by Oleta Bernhardt, PA for eval of hypoxia.  She states she has been having SOB since Winter 2017. She states she presented to her PCP with SOB in April 2019 and had o2 sat of 88%ra. She states she gets SOB walking for approx 5-10 min.  She has occ cough, prod in the am after inhaler with tan sputum.    sleeps horizontal rotated on L side wakes up each am feeling tired / hits snooze button twice /some am cough/ congestion and then uses combivent x one and w/in 15 min better then later on in the am finally gets around to taking the advair 500. Breathing worse in hot weather. 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 Plan A = Automatic = Symbicort  160 Take 2 puffs first thing in am and then another 2 puffs about 12 hours later.  Work on inhaler technique:  relax and gently blow all the way out then take a nice smooth deep breath back in, triggering the inhaler at same time you start breathing in.  Hold for up to 5 seconds if you can. Blow out thru nose. Rinse and gargle with water when done Plan B = Backup Only use your combivent as a rescue  medication   12/06/2021  f/u ov/Jennifer Werner re: GOLD 2  maint on spriva/ symbicort  160 but still smoking   Chief Complaint  Patient presents with   Follow-up    Breathing is overall doing well. She has cough with tan sputum. Smoking 1 ppd.   Dyspnea:  MMRC2 = can't walk a nl pace on a flat grade s sob but does fine slow and flat   Cough: more in am x months Sleeping: cpap flat bed  SABA use: not as much since using spiriva  one bid instead of  2 qam  02: none Covid status:   vax x 3  Rec No change in medications Keep appt for annual lung cancer screening  The key is to stop smoking completely before smoking completely stops you!    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  SOB with exertion persistent  Dyspnea:  MMRC2 = can't walk a nl pace on a flat grade s sob but does fine slow and flat eg  Cough: mostly in am's> tan  Sleeping: cpap/flat bed  SABA use: twice  daily  02: none  Covid status:   vax x 3/ infected x one  Lung cancer screening :  planned   Rec Plan A = Automatic = Always=    symbicort  160 and spiriva  2 puffs in am and repeat symbicort  160 x 2 in pam  Plan B = Backup (to supplement plan A, not to replace it) Only use your albuterol  inhaler as a rescue  The key is to stop smoking completely before smoking completely stops you and make sure you get your annual  low dose CT chest   Please remember to go to the lab department   for your tests - we will call you with the results when they are available.         04/15/2024  f/u ov/Jennifer Werner re: GOLD 2    maint on symbicort  /spiriva   / still smoking  Chief Complaint  Patient presents with   Follow-up    Breathing is the same.  Sob with exertion.   Dyspnea:  MMRC2 = can't walk a nl pace on a flat grade s sob but does fine slow and flat eg  Cough: am congestion . X 30 min to  Sleeping: flat one pillow s  resp cc fine on cpap   SABA use: 4-5 x per day  02: none   Lung cancer screening :  referred today    No obvious day to day or daytime variability or assoc excess/ purulent sputum or mucus plugs or hemoptysis or cp or chest tightness, subjective wheeze or overt sinus or hb symptoms.    Also denies any obvious fluctuation of symptoms with weather or environmental changes or other aggravating or alleviating factors except as outlined above   No unusual exposure hx or h/o childhood pna/ asthma or knowledge of premature birth.  Current Allergies, Complete Past Medical History, Past Surgical History, Family History, and Social History were reviewed in [employer] record.  ROS  The following are not active complaints unless bolded Hoarseness, sore throat, dysphagia, dental problems, itching, sneezing,  nasal congestion or discharge of excess mucus or purulent secretions, ear ache,   fever, chills, sweats, unintended wt loss or wt gain, classically pleuritic or exertional cp,  orthopnea pnd or arm/hand swelling  or leg swelling, presyncope, palpitations, abdominal pain, anorexia, nausea, vomiting, diarrhea  or change in bowel habits or change in bladder habits, change in stools or change in urine, dysuria, hematuria,  rash, arthralgias, visual complaints, headache, numbness, weakness or ataxia or problems with walking or coordination,  change in mood or  memory.        Current Meds  Medication Sig   albuterol  (VENTOLIN  HFA) 108 (90 Base) MCG/ACT inhaler 2 PUFFS EVERY 4 HOURS AS NEEDED ONLY IF YOUR CAN'T CATCH YOUR BREATH   ascorbic acid (VITAMIN C) 1000 MG tablet Take 1,000 mg by mouth daily.   aspirin EC 81 MG tablet Take 81 mg by mouth daily.   budesonide -formoterol  (SYMBICORT ) 160-4.5 MCG/ACT inhaler INHALE 2 PUFFS FIRST THING IN THE MORNING THEN ANOTHER 2 PUFFS 12 HOURS LATER   Cholecalciferol (VITAMIN D3) 1.25 MG (50000 UT) CAPS Take 40,000 Units by mouth once a week.   furosemide  (LASIX ) 40  MG tablet Take 1 tablet (40 mg total) by mouth daily.   gabapentin (NEURONTIN) 100 MG capsule Take 100 mg by mouth at bedtime as needed.   ibuprofen (ADVIL,MOTRIN) 200 MG tablet Take 400 mg by mouth as needed for mild pain, moderate pain or headache.  metFORMIN (GLUCOPHAGE) 500 MG tablet Take 500 mg by mouth 2 (two) times daily.    Multiple Vitamin (MULTIVITAMIN) tablet Take 1 tablet by mouth daily. Unknown strength   Omega-3 Fatty Acids (FISH OIL) 1000 MG CAPS Take 1 capsule by mouth daily.   potassium chloride  (KLOR-CON  M10) 10 MEQ tablet Take 1 tablet (10 mEq total) by mouth daily.   SPIRIVA  RESPIMAT 2.5 MCG/ACT AERS 2 pffs each am   spironolactone (ALDACTONE) 100 MG tablet Take 100 mg by mouth daily.   tiZANidine (ZANAFLEX) 4 MG tablet Take 4 mg by mouth 3 (three) times daily as needed for muscle spasms.           Objective:  Physical Exam   wts  04/15/2024          193  01/09/2023            197   12/06/2021            207   07/19/2020       189  06/09/2019         237  12/06/2018         233  08/22/2018       223   05/21/18 216 lb (98 kg)  04/30/18 214 lb 1.9 oz (97.1 kg)  04/25/18 215 lb (97.5 kg)    Vital signs reviewed  04/15/2024  - Note at rest 02 sats  92% on RA   General appearance:    amb mod obese (BY BMI) wf nad    HEENT : Oropharynx  clear      NECK :  without  apparent JVD/ palpable Nodes/TM    LUNGS: no acc muscle use,  Mild barrel  contour chest wall with bilateral  Distant bs s audible wheeze and  without cough on insp or exp maneuvers  and mild  Hyperresonant  to  percussion bilaterally     CV:  RRR  no s3 or murmur or increase in P2, and no edema   ABD:  soft and nontender with pos end  insp Hoover's  in the supine position.  No bruits or organomegaly appreciated   MS:  Nl gait/ ext warm without deformities Or obvious joint restrictions  calf tenderness, cyanosis or clubbing     SKIN: warm and dry without lesions    NEURO:  alert, approp, nl sensorium  with  no motor or cerebellar deficits apparent.          Assessment:

## 2024-04-15 ENCOUNTER — Ambulatory Visit: Admitting: Internal Medicine

## 2024-04-15 ENCOUNTER — Encounter: Payer: Self-pay | Admitting: Internal Medicine

## 2024-04-15 VITALS — BP 110/58 | HR 90 | Temp 83.5°F | Ht 64.0 in | Wt 193.0 lb

## 2024-04-15 DIAGNOSIS — J449 Chronic obstructive pulmonary disease, unspecified: Secondary | ICD-10-CM | POA: Diagnosis not present

## 2024-04-15 DIAGNOSIS — F17210 Nicotine dependence, cigarettes, uncomplicated: Secondary | ICD-10-CM

## 2024-04-15 MED ORDER — SPIRIVA RESPIMAT 2.5 MCG/ACT IN AERS: INHALATION_SPRAY | RESPIRATORY_TRACT | 3 refills | Status: DC

## 2024-04-15 MED ORDER — BUDESONIDE-FORMOTEROL FUMARATE 160-4.5 MCG/ACT IN AERO: INHALATION_SPRAY | RESPIRATORY_TRACT | 3 refills | Status: DC

## 2024-04-15 MED ORDER — ALBUTEROL SULFATE HFA 108 (90 BASE) MCG/ACT IN AERS: INHALATION_SPRAY | RESPIRATORY_TRACT | 3 refills | Status: DC

## 2024-04-15 MED ORDER — PANTOPRAZOLE SODIUM 40 MG PO TBEC: 40.0000 mg | DELAYED_RELEASE_TABLET | Freq: Every day | ORAL | 2 refills | Status: DC

## 2024-04-15 NOTE — Assessment & Plan Note (Addendum)
 Active smoker/MM  05/21/2018  Walked RA x 3 laps @ 185 ft each stopped due to  End of study, fast pace, no desat but sob at end of study   Spirometry 05/21/2018  FEV1 1.22 (45%)  Ratio 58 p am advair 500 PFT's  08/22/2018  FEV1 1.50 (56 % ) ratio 65  p 20 % improvement from saba p symb 160 x2  prior to study with DLCO  72 % corrects to 90 % for alv volume   - 12/06/2018   try bevespi  2 bid if insurance covers> preferred symb/spiriva   - Labs ordered 01/09/2023  :    alpha one AT phenotype  MM  level 167 - 04/15/2024  After extensive coaching inhaler device,  effectiveness =    75% (short Ti)    Group D (now reclassified as E) in terms of symptom/risk and laba/lama/ICS  therefore appropriate rx at this point >>>  symbicort  160/spiriva  2.5 x 2 puffs in am and symbicort  160 x 2 puffs in pm and need to use saba more approp:  Re SABA :  I spent extra time with pt today reviewing appropriate use of albuterol  for prn use on exertion with the following points: 1) saba is for relief of sob that does not improve by walking a slower pace or resting but rather if the pt does not improve after trying this first. 2) If the pt is convinced, as many are, that saba helps recover from activity faster then it's easy to tell if this is the case by re-challenging : ie stop, take the inhaler, then p 5 minutes try the exact same activity (intensity of workload) that just caused the symptoms and see if they are substantially diminished or not after saba 3) if there is an activity that reproducibly causes the symptoms, try the saba 15 min before the activity on alternate days   If in fact the saba really does help, then fine to continue to use it prn but advised may need to look closer at the maintenance regimen being used to achieve better control of airways disease with exertion.   F/u can be q 12 m, sooner prn         Each maintenance medication was reviewed in detail including emphasizing most importantly the difference  between maintenance and prns and under what circumstances the prns are to be triggered using an action plan format where appropriate.  Total time for H and P, chart review, counseling, reviewing hfa/smi device(s) and generating customized AVS unique to this office visit / same day charting = 20 min

## 2024-04-15 NOTE — Assessment & Plan Note (Signed)
 4-5 min discussion re active cigarette smoking in addition to office E&M  Ask about tobacco use:   ongoing  Advise quitting   I took an extended  opportunity with this patient to outline the consequences of continued cigarette use  in airway disorders based on all the data we have from the multiple national lung health studies (perfomed over decades at millions of dollars in cost)  indicating that smoking cessation, not choice of inhalers or pulmonary physicians, is the most important aspect of her  care.   Assess willingness:  Not committed at this point Assist in quit attempt:  Per PCP when ready Arrange follow up:   Follow up per Primary Care planned    Low-dose CT lung cancer screening is recommended for patients who are 59-75 years of age with a 20+ pack-year history of smoking and who are currently smoking or quit <=15 years ago. No coughing up blood  No unintentional weight loss of > 15 pounds in the last 6 months - pt is eligible for scanning yearly until 77 y p she quits smoking  > referred

## 2024-04-15 NOTE — Patient Instructions (Addendum)
 Pantoprazole (protonix) 40 mg   Take  30-60 min before first meal of the day and Pepcid (famotidine)  20 mg after supper until return to office - this is the best way to tell whether stomach acid is contributing to your problem.   Plan A = Automatic = Always=   Symbicort  160 x 2puffs follow with spiriva  and then another 2 puffs in 12 hours   Plan B = Backup (to supplement plan A, not to replace it) Only use your albuterol  inhaler as a rescue medication to be used if you can't catch your breath by resting or doing a relaxed purse lip breathing pattern.  - The less you use it, the better it will work when you need it. - Ok to use the inhaler up to 2 puffs  every 4 hours if you must but call for appointment if use goes up over your usual need - Don't leave home without it !!  (think of it like the spare tire for your car)   Plan C = Crisis (instead of Plan B but only if Plan B stops working) - only use your albuterol  nebulizer if you first try Plan B and it fails to help > ok to use the nebulizer up to every 4 hours but if start needing it regularly call for immediate appointment   My office will be contacting you by phone for referral to lung cancer screening   [phone]) - if you don't hear back from my office within one week,  please call us  back or notify us  thru MyChart and we'll address it right away.     Please schedule a follow up visit in 12  months but call sooner if needed

## 2024-04-21 ENCOUNTER — Telehealth: Payer: Self-pay | Admitting: Internal Medicine

## 2024-04-21 MED ORDER — PANTOPRAZOLE SODIUM 40 MG PO TBEC: 40.0000 mg | DELAYED_RELEASE_TABLET | Freq: Every day | ORAL | 3 refills | Status: AC

## 2024-04-21 MED ORDER — BUDESONIDE-FORMOTEROL FUMARATE 160-4.5 MCG/ACT IN AERO: INHALATION_SPRAY | RESPIRATORY_TRACT | 3 refills | Status: AC

## 2024-04-21 MED ORDER — ALBUTEROL SULFATE HFA 108 (90 BASE) MCG/ACT IN AERS: INHALATION_SPRAY | RESPIRATORY_TRACT | 3 refills | Status: DC

## 2024-04-21 MED ORDER — SPIRIVA RESPIMAT 2.5 MCG/ACT IN AERS: INHALATION_SPRAY | RESPIRATORY_TRACT | 3 refills | Status: AC

## 2024-04-21 NOTE — Telephone Encounter (Signed)
 Rx's sent to pharmacy.

## 2024-04-21 NOTE — Telephone Encounter (Signed)
 [facility]  request refill for OMEPRAZOLE DR CAP , SPIRIVA  RESPIMAT AER, SYMBICORT  AER, AND VENTOLIN  HFA AER  CALL BACK # [phone], FAX [phone]--PATIENT ID # [id number]

## 2024-05-05 ENCOUNTER — Other Ambulatory Visit: Payer: Self-pay | Admitting: Internal Medicine

## 2024-12-01 ENCOUNTER — Other Ambulatory Visit: Payer: Self-pay | Admitting: Internal Medicine
